# Patient Record
Sex: Female | Born: 1945 | Race: White | Hispanic: No | Marital: Married | Smoking: Former smoker
Health system: Southern US, Community
[De-identification: ages and names within clinical notes are randomized; demographics above are authoritative.]

## PROBLEM LIST (undated history)

## (undated) DIAGNOSIS — E119 Type 2 diabetes mellitus without complications: Secondary | ICD-10-CM

## (undated) DIAGNOSIS — J45909 Unspecified asthma, uncomplicated: Secondary | ICD-10-CM

## (undated) DIAGNOSIS — I10 Essential (primary) hypertension: Secondary | ICD-10-CM

## (undated) HISTORY — DX: Unspecified asthma, uncomplicated: J45.909

## (undated) HISTORY — DX: Essential (primary) hypertension: I10

## (undated) HISTORY — DX: Type 2 diabetes mellitus without complications: E11.9

---

## 1998-07-25 ENCOUNTER — Emergency Department (HOSPITAL_COMMUNITY): Admission: EM | Admit: 1998-07-25 | Discharge: 1998-07-25 | Payer: Self-pay | Admitting: Emergency Medicine

## 1998-07-25 ENCOUNTER — Encounter: Payer: Self-pay | Admitting: Emergency Medicine

## 1999-07-19 ENCOUNTER — Other Ambulatory Visit: Admission: RE | Admit: 1999-07-19 | Discharge: 1999-07-19 | Payer: Self-pay | Admitting: Family Medicine

## 2000-03-02 ENCOUNTER — Encounter: Payer: Self-pay | Admitting: Family Medicine

## 2000-03-02 ENCOUNTER — Encounter: Admission: RE | Admit: 2000-03-02 | Discharge: 2000-03-02 | Payer: Self-pay | Admitting: Family Medicine

## 2001-03-30 ENCOUNTER — Encounter: Admission: RE | Admit: 2001-03-30 | Discharge: 2001-03-30 | Payer: Self-pay | Admitting: Family Medicine

## 2001-03-30 ENCOUNTER — Encounter: Payer: Self-pay | Admitting: Family Medicine

## 2001-04-21 ENCOUNTER — Encounter: Payer: Self-pay | Admitting: Family Medicine

## 2001-04-21 ENCOUNTER — Encounter: Admission: RE | Admit: 2001-04-21 | Discharge: 2001-04-21 | Payer: Self-pay | Admitting: Family Medicine

## 2001-10-04 ENCOUNTER — Encounter: Payer: Self-pay | Admitting: Family Medicine

## 2001-10-04 ENCOUNTER — Encounter: Admission: RE | Admit: 2001-10-04 | Discharge: 2001-10-04 | Payer: Self-pay | Admitting: Family Medicine

## 2002-03-01 ENCOUNTER — Encounter: Admission: RE | Admit: 2002-03-01 | Discharge: 2002-03-01 | Payer: Self-pay | Admitting: Family Medicine

## 2002-03-01 ENCOUNTER — Encounter: Payer: Self-pay | Admitting: Family Medicine

## 2002-05-09 ENCOUNTER — Encounter: Payer: Self-pay | Admitting: Pulmonary Disease

## 2002-05-09 ENCOUNTER — Ambulatory Visit (HOSPITAL_COMMUNITY): Admission: RE | Admit: 2002-05-09 | Discharge: 2002-05-09 | Payer: Self-pay | Admitting: Pulmonary Disease

## 2002-06-29 ENCOUNTER — Other Ambulatory Visit: Admission: RE | Admit: 2002-06-29 | Discharge: 2002-06-29 | Payer: Self-pay | Admitting: Family Medicine

## 2004-01-03 ENCOUNTER — Other Ambulatory Visit: Admission: RE | Admit: 2004-01-03 | Discharge: 2004-01-03 | Payer: Self-pay | Admitting: Family Medicine

## 2004-09-20 ENCOUNTER — Encounter: Admission: RE | Admit: 2004-09-20 | Discharge: 2004-09-20 | Payer: Self-pay | Admitting: Family Medicine

## 2004-11-28 ENCOUNTER — Encounter: Admission: RE | Admit: 2004-11-28 | Discharge: 2004-11-28 | Payer: Self-pay | Admitting: Family Medicine

## 2004-12-19 ENCOUNTER — Encounter: Admission: RE | Admit: 2004-12-19 | Discharge: 2004-12-19 | Payer: Self-pay | Admitting: Family Medicine

## 2005-02-05 ENCOUNTER — Ambulatory Visit: Payer: Self-pay | Admitting: Internal Medicine

## 2005-03-24 ENCOUNTER — Ambulatory Visit: Payer: Self-pay | Admitting: Internal Medicine

## 2005-05-05 ENCOUNTER — Encounter: Admission: RE | Admit: 2005-05-05 | Discharge: 2005-05-05 | Payer: Self-pay | Admitting: Orthopedic Surgery

## 2005-06-18 ENCOUNTER — Ambulatory Visit: Payer: Self-pay | Admitting: Internal Medicine

## 2005-06-23 ENCOUNTER — Ambulatory Visit: Payer: Self-pay | Admitting: Internal Medicine

## 2005-06-25 ENCOUNTER — Ambulatory Visit: Payer: Self-pay | Admitting: Internal Medicine

## 2005-07-10 ENCOUNTER — Encounter (HOSPITAL_COMMUNITY): Admission: RE | Admit: 2005-07-10 | Discharge: 2005-09-10 | Payer: Self-pay | Admitting: Internal Medicine

## 2005-07-23 ENCOUNTER — Ambulatory Visit: Payer: Self-pay | Admitting: Internal Medicine

## 2005-09-05 ENCOUNTER — Ambulatory Visit: Payer: Self-pay | Admitting: Internal Medicine

## 2005-12-09 ENCOUNTER — Ambulatory Visit: Payer: Self-pay | Admitting: Internal Medicine

## 2006-03-31 ENCOUNTER — Encounter: Admission: RE | Admit: 2006-03-31 | Discharge: 2006-03-31 | Payer: Self-pay | Admitting: Orthopedic Surgery

## 2009-02-06 ENCOUNTER — Encounter: Admission: RE | Admit: 2009-02-06 | Discharge: 2009-02-06 | Payer: Self-pay | Admitting: Family Medicine

## 2009-02-13 ENCOUNTER — Encounter: Admission: RE | Admit: 2009-02-13 | Discharge: 2009-02-13 | Payer: Self-pay | Admitting: Family Medicine

## 2011-05-15 ENCOUNTER — Other Ambulatory Visit: Payer: Self-pay | Admitting: Family Medicine

## 2011-05-15 DIAGNOSIS — Z780 Asymptomatic menopausal state: Secondary | ICD-10-CM

## 2011-05-15 DIAGNOSIS — Z1231 Encounter for screening mammogram for malignant neoplasm of breast: Secondary | ICD-10-CM

## 2011-05-29 ENCOUNTER — Ambulatory Visit
Admission: RE | Admit: 2011-05-29 | Discharge: 2011-05-29 | Disposition: A | Discharge status: Home / Self Care | Payer: Medicare Other | Source: Ambulatory Visit | Attending: Family Medicine | Admitting: Family Medicine

## 2011-05-29 DIAGNOSIS — Z780 Asymptomatic menopausal state: Secondary | ICD-10-CM

## 2011-05-29 DIAGNOSIS — Z1231 Encounter for screening mammogram for malignant neoplasm of breast: Secondary | ICD-10-CM

## 2013-01-21 ENCOUNTER — Other Ambulatory Visit: Payer: Self-pay | Admitting: Family Medicine

## 2013-01-21 DIAGNOSIS — Z1231 Encounter for screening mammogram for malignant neoplasm of breast: Secondary | ICD-10-CM

## 2013-02-23 ENCOUNTER — Ambulatory Visit
Admission: RE | Admit: 2013-02-23 | Discharge: 2013-02-23 | Disposition: A | Discharge status: Home / Self Care | Payer: Medicare Other | Source: Ambulatory Visit | Attending: Family Medicine | Admitting: Family Medicine

## 2013-02-23 DIAGNOSIS — Z1231 Encounter for screening mammogram for malignant neoplasm of breast: Secondary | ICD-10-CM

## 2015-03-05 ENCOUNTER — Other Ambulatory Visit: Payer: Self-pay | Admitting: Family Medicine

## 2015-03-05 ENCOUNTER — Ambulatory Visit
Admission: RE | Admit: 2015-03-05 | Discharge: 2015-03-05 | Disposition: A | Discharge status: Home / Self Care | Payer: PPO | Source: Ambulatory Visit | Attending: Family Medicine | Admitting: Family Medicine

## 2015-03-05 DIAGNOSIS — R0602 Shortness of breath: Secondary | ICD-10-CM

## 2016-05-19 DIAGNOSIS — E119 Type 2 diabetes mellitus without complications: Secondary | ICD-10-CM | POA: Diagnosis not present

## 2016-05-19 DIAGNOSIS — Z23 Encounter for immunization: Secondary | ICD-10-CM | POA: Diagnosis not present

## 2016-05-19 DIAGNOSIS — E039 Hypothyroidism, unspecified: Secondary | ICD-10-CM | POA: Diagnosis not present

## 2016-05-19 DIAGNOSIS — E559 Vitamin D deficiency, unspecified: Secondary | ICD-10-CM | POA: Diagnosis not present

## 2016-05-19 DIAGNOSIS — E785 Hyperlipidemia, unspecified: Secondary | ICD-10-CM | POA: Diagnosis not present

## 2016-05-20 ENCOUNTER — Other Ambulatory Visit: Payer: Self-pay | Admitting: Family Medicine

## 2016-05-20 ENCOUNTER — Ambulatory Visit
Admission: RE | Admit: 2016-05-20 | Discharge: 2016-05-20 | Disposition: A | Discharge status: Home / Self Care | Payer: PPO | Source: Ambulatory Visit | Attending: Family Medicine | Admitting: Family Medicine

## 2016-05-20 DIAGNOSIS — M170 Bilateral primary osteoarthritis of knee: Secondary | ICD-10-CM | POA: Diagnosis not present

## 2016-05-20 DIAGNOSIS — E119 Type 2 diabetes mellitus without complications: Secondary | ICD-10-CM | POA: Diagnosis not present

## 2016-05-20 DIAGNOSIS — E039 Hypothyroidism, unspecified: Secondary | ICD-10-CM | POA: Diagnosis not present

## 2016-05-20 DIAGNOSIS — E782 Mixed hyperlipidemia: Secondary | ICD-10-CM | POA: Diagnosis not present

## 2016-08-19 DIAGNOSIS — E119 Type 2 diabetes mellitus without complications: Secondary | ICD-10-CM | POA: Diagnosis not present

## 2016-08-19 DIAGNOSIS — E039 Hypothyroidism, unspecified: Secondary | ICD-10-CM | POA: Diagnosis not present

## 2016-08-21 DIAGNOSIS — E119 Type 2 diabetes mellitus without complications: Secondary | ICD-10-CM | POA: Diagnosis not present

## 2016-08-21 DIAGNOSIS — I10 Essential (primary) hypertension: Secondary | ICD-10-CM | POA: Diagnosis not present

## 2016-08-21 DIAGNOSIS — Z23 Encounter for immunization: Secondary | ICD-10-CM | POA: Diagnosis not present

## 2016-08-21 DIAGNOSIS — E039 Hypothyroidism, unspecified: Secondary | ICD-10-CM | POA: Diagnosis not present

## 2016-08-21 DIAGNOSIS — Z0000 Encounter for general adult medical examination without abnormal findings: Secondary | ICD-10-CM | POA: Diagnosis not present

## 2016-09-01 DIAGNOSIS — M170 Bilateral primary osteoarthritis of knee: Secondary | ICD-10-CM | POA: Diagnosis not present

## 2016-09-08 DIAGNOSIS — M170 Bilateral primary osteoarthritis of knee: Secondary | ICD-10-CM | POA: Diagnosis not present

## 2016-09-15 DIAGNOSIS — M25569 Pain in unspecified knee: Secondary | ICD-10-CM | POA: Diagnosis not present

## 2016-09-15 DIAGNOSIS — M170 Bilateral primary osteoarthritis of knee: Secondary | ICD-10-CM | POA: Diagnosis not present

## 2016-09-22 DIAGNOSIS — M170 Bilateral primary osteoarthritis of knee: Secondary | ICD-10-CM | POA: Diagnosis not present

## 2016-09-22 DIAGNOSIS — Z6838 Body mass index (BMI) 38.0-38.9, adult: Secondary | ICD-10-CM | POA: Diagnosis not present

## 2016-09-29 DIAGNOSIS — M170 Bilateral primary osteoarthritis of knee: Secondary | ICD-10-CM | POA: Diagnosis not present

## 2017-01-13 ENCOUNTER — Other Ambulatory Visit: Payer: Self-pay | Admitting: Family Medicine

## 2017-01-13 DIAGNOSIS — Z6838 Body mass index (BMI) 38.0-38.9, adult: Secondary | ICD-10-CM | POA: Diagnosis not present

## 2017-01-13 DIAGNOSIS — Z23 Encounter for immunization: Secondary | ICD-10-CM | POA: Diagnosis not present

## 2017-01-13 DIAGNOSIS — Z1231 Encounter for screening mammogram for malignant neoplasm of breast: Secondary | ICD-10-CM

## 2017-01-13 DIAGNOSIS — M8580 Other specified disorders of bone density and structure, unspecified site: Secondary | ICD-10-CM

## 2017-01-13 DIAGNOSIS — Z1211 Encounter for screening for malignant neoplasm of colon: Secondary | ICD-10-CM | POA: Diagnosis not present

## 2017-01-13 DIAGNOSIS — Z0000 Encounter for general adult medical examination without abnormal findings: Secondary | ICD-10-CM | POA: Diagnosis not present

## 2017-01-29 ENCOUNTER — Ambulatory Visit
Admission: RE | Admit: 2017-01-29 | Discharge: 2017-01-29 | Disposition: A | Discharge status: Home / Self Care | Payer: PPO | Source: Ambulatory Visit | Attending: Family Medicine | Admitting: Family Medicine

## 2017-01-29 DIAGNOSIS — M8589 Other specified disorders of bone density and structure, multiple sites: Secondary | ICD-10-CM | POA: Diagnosis not present

## 2017-01-29 DIAGNOSIS — M8580 Other specified disorders of bone density and structure, unspecified site: Secondary | ICD-10-CM

## 2017-01-29 DIAGNOSIS — Z1231 Encounter for screening mammogram for malignant neoplasm of breast: Secondary | ICD-10-CM

## 2017-01-29 DIAGNOSIS — Z780 Asymptomatic menopausal state: Secondary | ICD-10-CM | POA: Diagnosis not present

## 2017-02-16 DIAGNOSIS — E039 Hypothyroidism, unspecified: Secondary | ICD-10-CM | POA: Diagnosis not present

## 2017-02-16 DIAGNOSIS — Z136 Encounter for screening for cardiovascular disorders: Secondary | ICD-10-CM | POA: Diagnosis not present

## 2017-02-16 DIAGNOSIS — I10 Essential (primary) hypertension: Secondary | ICD-10-CM | POA: Diagnosis not present

## 2017-02-16 DIAGNOSIS — E119 Type 2 diabetes mellitus without complications: Secondary | ICD-10-CM | POA: Diagnosis not present

## 2017-02-18 DIAGNOSIS — E119 Type 2 diabetes mellitus without complications: Secondary | ICD-10-CM | POA: Diagnosis not present

## 2017-02-18 DIAGNOSIS — I10 Essential (primary) hypertension: Secondary | ICD-10-CM | POA: Diagnosis not present

## 2017-02-18 DIAGNOSIS — E785 Hyperlipidemia, unspecified: Secondary | ICD-10-CM | POA: Diagnosis not present

## 2017-02-18 DIAGNOSIS — E039 Hypothyroidism, unspecified: Secondary | ICD-10-CM | POA: Diagnosis not present

## 2017-03-12 ENCOUNTER — Encounter: Payer: Self-pay | Admitting: Family Medicine

## 2017-03-31 DIAGNOSIS — H524 Presbyopia: Secondary | ICD-10-CM | POA: Diagnosis not present

## 2017-03-31 DIAGNOSIS — H2513 Age-related nuclear cataract, bilateral: Secondary | ICD-10-CM | POA: Diagnosis not present

## 2017-03-31 DIAGNOSIS — H353131 Nonexudative age-related macular degeneration, bilateral, early dry stage: Secondary | ICD-10-CM | POA: Diagnosis not present

## 2017-04-20 DIAGNOSIS — Z6838 Body mass index (BMI) 38.0-38.9, adult: Secondary | ICD-10-CM | POA: Diagnosis not present

## 2017-04-20 DIAGNOSIS — M8580 Other specified disorders of bone density and structure, unspecified site: Secondary | ICD-10-CM | POA: Diagnosis not present

## 2017-04-20 DIAGNOSIS — E559 Vitamin D deficiency, unspecified: Secondary | ICD-10-CM | POA: Diagnosis not present

## 2017-05-18 DIAGNOSIS — I10 Essential (primary) hypertension: Secondary | ICD-10-CM | POA: Diagnosis not present

## 2017-05-18 DIAGNOSIS — E039 Hypothyroidism, unspecified: Secondary | ICD-10-CM | POA: Diagnosis not present

## 2017-05-18 DIAGNOSIS — E119 Type 2 diabetes mellitus without complications: Secondary | ICD-10-CM | POA: Diagnosis not present

## 2017-05-18 DIAGNOSIS — E559 Vitamin D deficiency, unspecified: Secondary | ICD-10-CM | POA: Diagnosis not present

## 2017-05-22 DIAGNOSIS — E039 Hypothyroidism, unspecified: Secondary | ICD-10-CM | POA: Diagnosis not present

## 2017-05-22 DIAGNOSIS — I10 Essential (primary) hypertension: Secondary | ICD-10-CM | POA: Diagnosis not present

## 2017-05-22 DIAGNOSIS — Z23 Encounter for immunization: Secondary | ICD-10-CM | POA: Diagnosis not present

## 2017-05-22 DIAGNOSIS — E119 Type 2 diabetes mellitus without complications: Secondary | ICD-10-CM | POA: Diagnosis not present

## 2017-05-22 DIAGNOSIS — Z6839 Body mass index (BMI) 39.0-39.9, adult: Secondary | ICD-10-CM | POA: Diagnosis not present

## 2017-11-09 DIAGNOSIS — J441 Chronic obstructive pulmonary disease with (acute) exacerbation: Secondary | ICD-10-CM | POA: Diagnosis not present

## 2017-11-09 DIAGNOSIS — J209 Acute bronchitis, unspecified: Secondary | ICD-10-CM | POA: Diagnosis not present

## 2017-11-09 DIAGNOSIS — J069 Acute upper respiratory infection, unspecified: Secondary | ICD-10-CM | POA: Diagnosis not present

## 2017-11-11 DIAGNOSIS — Z6839 Body mass index (BMI) 39.0-39.9, adult: Secondary | ICD-10-CM | POA: Diagnosis not present

## 2017-11-11 DIAGNOSIS — J189 Pneumonia, unspecified organism: Secondary | ICD-10-CM | POA: Diagnosis not present

## 2017-11-11 DIAGNOSIS — J441 Chronic obstructive pulmonary disease with (acute) exacerbation: Secondary | ICD-10-CM | POA: Diagnosis not present

## 2017-11-11 DIAGNOSIS — J111 Influenza due to unidentified influenza virus with other respiratory manifestations: Secondary | ICD-10-CM | POA: Diagnosis not present

## 2017-11-18 DIAGNOSIS — J189 Pneumonia, unspecified organism: Secondary | ICD-10-CM | POA: Diagnosis not present

## 2017-11-18 DIAGNOSIS — E039 Hypothyroidism, unspecified: Secondary | ICD-10-CM | POA: Diagnosis not present

## 2017-11-18 DIAGNOSIS — J111 Influenza due to unidentified influenza virus with other respiratory manifestations: Secondary | ICD-10-CM | POA: Diagnosis not present

## 2017-11-18 DIAGNOSIS — J441 Chronic obstructive pulmonary disease with (acute) exacerbation: Secondary | ICD-10-CM | POA: Diagnosis not present

## 2017-11-18 DIAGNOSIS — E559 Vitamin D deficiency, unspecified: Secondary | ICD-10-CM | POA: Diagnosis not present

## 2017-11-18 DIAGNOSIS — I10 Essential (primary) hypertension: Secondary | ICD-10-CM | POA: Diagnosis not present

## 2017-11-20 DIAGNOSIS — I10 Essential (primary) hypertension: Secondary | ICD-10-CM | POA: Diagnosis not present

## 2017-11-20 DIAGNOSIS — E039 Hypothyroidism, unspecified: Secondary | ICD-10-CM | POA: Diagnosis not present

## 2017-11-20 DIAGNOSIS — Z6839 Body mass index (BMI) 39.0-39.9, adult: Secondary | ICD-10-CM | POA: Diagnosis not present

## 2017-11-20 DIAGNOSIS — E782 Mixed hyperlipidemia: Secondary | ICD-10-CM | POA: Diagnosis not present

## 2017-11-20 DIAGNOSIS — E559 Vitamin D deficiency, unspecified: Secondary | ICD-10-CM | POA: Diagnosis not present

## 2017-12-23 DIAGNOSIS — E119 Type 2 diabetes mellitus without complications: Secondary | ICD-10-CM | POA: Diagnosis not present

## 2017-12-23 DIAGNOSIS — I10 Essential (primary) hypertension: Secondary | ICD-10-CM | POA: Diagnosis not present

## 2017-12-23 DIAGNOSIS — E785 Hyperlipidemia, unspecified: Secondary | ICD-10-CM | POA: Diagnosis not present

## 2017-12-23 DIAGNOSIS — E039 Hypothyroidism, unspecified: Secondary | ICD-10-CM | POA: Diagnosis not present

## 2017-12-25 ENCOUNTER — Ambulatory Visit
Admission: RE | Admit: 2017-12-25 | Discharge: 2017-12-25 | Disposition: A | Discharge status: Home / Self Care | Payer: PPO | Source: Ambulatory Visit | Attending: Family Medicine | Admitting: Family Medicine

## 2017-12-25 ENCOUNTER — Other Ambulatory Visit: Payer: Self-pay | Admitting: Family Medicine

## 2017-12-25 DIAGNOSIS — M170 Bilateral primary osteoarthritis of knee: Secondary | ICD-10-CM | POA: Diagnosis not present

## 2017-12-25 DIAGNOSIS — M1711 Unilateral primary osteoarthritis, right knee: Secondary | ICD-10-CM | POA: Diagnosis not present

## 2017-12-25 DIAGNOSIS — E039 Hypothyroidism, unspecified: Secondary | ICD-10-CM | POA: Diagnosis not present

## 2017-12-25 DIAGNOSIS — I10 Essential (primary) hypertension: Secondary | ICD-10-CM | POA: Diagnosis not present

## 2017-12-25 DIAGNOSIS — E559 Vitamin D deficiency, unspecified: Secondary | ICD-10-CM | POA: Diagnosis not present

## 2017-12-25 DIAGNOSIS — Z6841 Body Mass Index (BMI) 40.0 and over, adult: Secondary | ICD-10-CM | POA: Diagnosis not present

## 2017-12-25 DIAGNOSIS — M1712 Unilateral primary osteoarthritis, left knee: Secondary | ICD-10-CM | POA: Diagnosis not present

## 2017-12-25 DIAGNOSIS — E782 Mixed hyperlipidemia: Secondary | ICD-10-CM | POA: Diagnosis not present

## 2018-01-19 ENCOUNTER — Other Ambulatory Visit: Payer: Self-pay | Admitting: Family Medicine

## 2018-01-19 DIAGNOSIS — I10 Essential (primary) hypertension: Secondary | ICD-10-CM | POA: Diagnosis not present

## 2018-01-19 DIAGNOSIS — Z1231 Encounter for screening mammogram for malignant neoplasm of breast: Secondary | ICD-10-CM

## 2018-01-19 DIAGNOSIS — Z0000 Encounter for general adult medical examination without abnormal findings: Secondary | ICD-10-CM | POA: Diagnosis not present

## 2018-01-19 DIAGNOSIS — E119 Type 2 diabetes mellitus without complications: Secondary | ICD-10-CM | POA: Diagnosis not present

## 2018-01-19 DIAGNOSIS — Z1211 Encounter for screening for malignant neoplasm of colon: Secondary | ICD-10-CM | POA: Diagnosis not present

## 2018-01-19 DIAGNOSIS — Z6841 Body Mass Index (BMI) 40.0 and over, adult: Secondary | ICD-10-CM | POA: Diagnosis not present

## 2018-01-19 DIAGNOSIS — E039 Hypothyroidism, unspecified: Secondary | ICD-10-CM | POA: Diagnosis not present

## 2018-01-19 DIAGNOSIS — E785 Hyperlipidemia, unspecified: Secondary | ICD-10-CM | POA: Diagnosis not present

## 2018-02-09 ENCOUNTER — Ambulatory Visit
Admission: RE | Admit: 2018-02-09 | Discharge: 2018-02-09 | Disposition: A | Discharge status: Home / Self Care | Payer: PPO | Source: Ambulatory Visit | Attending: Family Medicine | Admitting: Family Medicine

## 2018-02-09 DIAGNOSIS — Z1231 Encounter for screening mammogram for malignant neoplasm of breast: Secondary | ICD-10-CM | POA: Diagnosis not present

## 2018-02-10 DIAGNOSIS — E039 Hypothyroidism, unspecified: Secondary | ICD-10-CM | POA: Diagnosis not present

## 2018-02-12 DIAGNOSIS — Z6839 Body mass index (BMI) 39.0-39.9, adult: Secondary | ICD-10-CM | POA: Diagnosis not present

## 2018-02-12 DIAGNOSIS — K219 Gastro-esophageal reflux disease without esophagitis: Secondary | ICD-10-CM | POA: Diagnosis not present

## 2018-02-12 DIAGNOSIS — E039 Hypothyroidism, unspecified: Secondary | ICD-10-CM | POA: Diagnosis not present

## 2018-03-16 DIAGNOSIS — E039 Hypothyroidism, unspecified: Secondary | ICD-10-CM | POA: Diagnosis not present

## 2018-03-16 DIAGNOSIS — E785 Hyperlipidemia, unspecified: Secondary | ICD-10-CM | POA: Diagnosis not present

## 2018-03-16 DIAGNOSIS — I10 Essential (primary) hypertension: Secondary | ICD-10-CM | POA: Diagnosis not present

## 2018-03-16 DIAGNOSIS — E119 Type 2 diabetes mellitus without complications: Secondary | ICD-10-CM | POA: Diagnosis not present

## 2018-03-18 DIAGNOSIS — I10 Essential (primary) hypertension: Secondary | ICD-10-CM | POA: Diagnosis not present

## 2018-03-18 DIAGNOSIS — E782 Mixed hyperlipidemia: Secondary | ICD-10-CM | POA: Diagnosis not present

## 2018-03-18 DIAGNOSIS — E119 Type 2 diabetes mellitus without complications: Secondary | ICD-10-CM | POA: Diagnosis not present

## 2018-03-18 DIAGNOSIS — Z6841 Body Mass Index (BMI) 40.0 and over, adult: Secondary | ICD-10-CM | POA: Diagnosis not present

## 2018-04-15 ENCOUNTER — Encounter: Payer: Self-pay | Admitting: Family Medicine

## 2018-06-15 DIAGNOSIS — E119 Type 2 diabetes mellitus without complications: Secondary | ICD-10-CM | POA: Diagnosis not present

## 2018-06-15 DIAGNOSIS — E559 Vitamin D deficiency, unspecified: Secondary | ICD-10-CM | POA: Diagnosis not present

## 2018-06-15 DIAGNOSIS — I10 Essential (primary) hypertension: Secondary | ICD-10-CM | POA: Diagnosis not present

## 2018-06-15 DIAGNOSIS — E039 Hypothyroidism, unspecified: Secondary | ICD-10-CM | POA: Diagnosis not present

## 2018-06-18 DIAGNOSIS — E119 Type 2 diabetes mellitus without complications: Secondary | ICD-10-CM | POA: Diagnosis not present

## 2018-06-18 DIAGNOSIS — E039 Hypothyroidism, unspecified: Secondary | ICD-10-CM | POA: Diagnosis not present

## 2018-06-18 DIAGNOSIS — E559 Vitamin D deficiency, unspecified: Secondary | ICD-10-CM | POA: Diagnosis not present

## 2018-06-18 DIAGNOSIS — Z6841 Body Mass Index (BMI) 40.0 and over, adult: Secondary | ICD-10-CM | POA: Diagnosis not present

## 2018-07-16 DIAGNOSIS — Z6841 Body Mass Index (BMI) 40.0 and over, adult: Secondary | ICD-10-CM | POA: Diagnosis not present

## 2018-07-16 DIAGNOSIS — I10 Essential (primary) hypertension: Secondary | ICD-10-CM | POA: Diagnosis not present

## 2018-07-16 DIAGNOSIS — E119 Type 2 diabetes mellitus without complications: Secondary | ICD-10-CM | POA: Diagnosis not present

## 2018-07-28 DIAGNOSIS — J449 Chronic obstructive pulmonary disease, unspecified: Secondary | ICD-10-CM | POA: Diagnosis not present

## 2018-07-28 DIAGNOSIS — J069 Acute upper respiratory infection, unspecified: Secondary | ICD-10-CM | POA: Diagnosis not present

## 2018-07-28 DIAGNOSIS — Z6841 Body Mass Index (BMI) 40.0 and over, adult: Secondary | ICD-10-CM | POA: Diagnosis not present

## 2018-07-28 DIAGNOSIS — I10 Essential (primary) hypertension: Secondary | ICD-10-CM | POA: Diagnosis not present

## 2018-07-28 DIAGNOSIS — E119 Type 2 diabetes mellitus without complications: Secondary | ICD-10-CM | POA: Diagnosis not present

## 2018-09-15 DIAGNOSIS — E119 Type 2 diabetes mellitus without complications: Secondary | ICD-10-CM | POA: Diagnosis not present

## 2018-09-15 DIAGNOSIS — E039 Hypothyroidism, unspecified: Secondary | ICD-10-CM | POA: Diagnosis not present

## 2018-09-15 DIAGNOSIS — E559 Vitamin D deficiency, unspecified: Secondary | ICD-10-CM | POA: Diagnosis not present

## 2018-09-17 DIAGNOSIS — Z23 Encounter for immunization: Secondary | ICD-10-CM | POA: Diagnosis not present

## 2018-09-17 DIAGNOSIS — K219 Gastro-esophageal reflux disease without esophagitis: Secondary | ICD-10-CM | POA: Diagnosis not present

## 2018-09-17 DIAGNOSIS — E559 Vitamin D deficiency, unspecified: Secondary | ICD-10-CM | POA: Diagnosis not present

## 2018-09-17 DIAGNOSIS — Z6841 Body Mass Index (BMI) 40.0 and over, adult: Secondary | ICD-10-CM | POA: Diagnosis not present

## 2018-09-17 DIAGNOSIS — E119 Type 2 diabetes mellitus without complications: Secondary | ICD-10-CM | POA: Diagnosis not present

## 2018-09-17 DIAGNOSIS — E039 Hypothyroidism, unspecified: Secondary | ICD-10-CM | POA: Diagnosis not present

## 2018-10-01 DIAGNOSIS — Z6839 Body mass index (BMI) 39.0-39.9, adult: Secondary | ICD-10-CM | POA: Diagnosis not present

## 2018-10-01 DIAGNOSIS — J069 Acute upper respiratory infection, unspecified: Secondary | ICD-10-CM | POA: Diagnosis not present

## 2018-10-01 DIAGNOSIS — J441 Chronic obstructive pulmonary disease with (acute) exacerbation: Secondary | ICD-10-CM | POA: Diagnosis not present

## 2018-10-05 DIAGNOSIS — J449 Chronic obstructive pulmonary disease, unspecified: Secondary | ICD-10-CM | POA: Diagnosis not present

## 2018-10-05 DIAGNOSIS — Z6839 Body mass index (BMI) 39.0-39.9, adult: Secondary | ICD-10-CM | POA: Diagnosis not present

## 2018-10-05 DIAGNOSIS — J441 Chronic obstructive pulmonary disease with (acute) exacerbation: Secondary | ICD-10-CM | POA: Diagnosis not present

## 2018-10-05 DIAGNOSIS — I10 Essential (primary) hypertension: Secondary | ICD-10-CM | POA: Diagnosis not present

## 2018-11-05 DIAGNOSIS — E039 Hypothyroidism, unspecified: Secondary | ICD-10-CM | POA: Diagnosis not present

## 2018-11-16 DIAGNOSIS — E119 Type 2 diabetes mellitus without complications: Secondary | ICD-10-CM | POA: Diagnosis not present

## 2018-11-16 DIAGNOSIS — I10 Essential (primary) hypertension: Secondary | ICD-10-CM | POA: Diagnosis not present

## 2018-11-16 DIAGNOSIS — E039 Hypothyroidism, unspecified: Secondary | ICD-10-CM | POA: Diagnosis not present

## 2018-11-16 DIAGNOSIS — Z719 Counseling, unspecified: Secondary | ICD-10-CM | POA: Diagnosis not present

## 2018-12-30 DIAGNOSIS — J449 Chronic obstructive pulmonary disease, unspecified: Secondary | ICD-10-CM | POA: Diagnosis not present

## 2018-12-30 DIAGNOSIS — I10 Essential (primary) hypertension: Secondary | ICD-10-CM | POA: Diagnosis not present

## 2018-12-30 DIAGNOSIS — E119 Type 2 diabetes mellitus without complications: Secondary | ICD-10-CM | POA: Diagnosis not present

## 2018-12-30 DIAGNOSIS — E039 Hypothyroidism, unspecified: Secondary | ICD-10-CM | POA: Diagnosis not present

## 2019-01-19 ENCOUNTER — Other Ambulatory Visit: Payer: Self-pay

## 2019-01-19 NOTE — Patient Outreach (Signed)
[facility]) Care Management  01/19/2019  Gabrielle Randall [birth year] [id number]   Telephone Screen  Referral Date: 01/19/2019 Referral Source: MD Office Referral Reason: "pt. Can't get any insulin or Ozempic due to expense of drugs with her insurance,need help getting meds she can't afford" Insurance: HTA   Incoming call from patient returning RN CM call. Spoke with patient regarding referral source and reason. She shares that every year she runs into the donut hole early and her Diabetic meds become entirely too expensive for her to afford. She voices last year she was taking Ozempic and Janumet. However, Ozempic became too expensive and she had to stop taking it. This year she is currently on Lantus and Janumet. However, patient concerned and does not feel like "Lantus is really working." She states that she feels like her blood sugars were better controlled on Ozempic. Patient report that she has only been on Lantus for about two weeks or so. She voices that cbgs are ranging in the mornings around 130-150s which she feels is too high. PCP has been managing Diabetes for the past four or five years since patient was diagnosed. However, she voices that she is considering seeing a specialist. She states she was given coupons for Diabetic meds but because she has Medicare she is not able to use them. Patient voices she is taking about five meds total. She denies any issues affording and/or managing her other meds. She report that she is knowledgeable regarding Diabetes and adhering to diet regimen. She is independent with ADLs/IADLs. She lives with supportive spouse and has three daughters that live nearby to assist her. She denies any issues with transportation. [facility] services reviewed and discussed with patient. She feels that she only needs pharmacy assistance at this time and provided verbal consent for services.   Plan: RN CM will send [facility] pharmacy referral for possible med  assistance.   [name], RN,BSN,CCM [facility] Care Management Telephonic Care Management Coordinator Direct Phone: [phone] Toll Free: [phone] Fax: [phone]

## 2019-01-19 NOTE — Patient Outreach (Signed)
[facility]) Care Management  01/19/2019  Gabrielle Randall Dec 28, 1945 [id number]   Telephone Screen  Referral Date: 01/19/2019 Referral Source: MD Office Referral Reason: "pt. Can't get any insulin or Ozempic due to expense of drugs with her insurance,need help getting meds she can't afford" Insurance: HTA   Outreach attempt # 1 to patient. No answer. RN CM left HIPAA compliant voicemail message along with contact info.    Plan: RN CM will make outreach attempt to patient within 3-4 business days. RN CM will send unsuccessful outreach letter to patient.   [name], RN,BSN,CCM [facility] Care Management Telephonic Care Management Coordinator Direct Phone: [phone] Toll Free: [phone] Fax: [phone]

## 2019-01-21 ENCOUNTER — Other Ambulatory Visit: Payer: Self-pay | Admitting: Pharmacist

## 2019-01-21 ENCOUNTER — Other Ambulatory Visit: Payer: Self-pay | Admitting: Pharmacy Technician

## 2019-01-21 NOTE — Patient Outreach (Signed)
[place] [facility]) Care Management  [place]   01/21/2019  Gabrielle Randall Jan 09, 1946 [id number]  Reason for referral: Medication Assistance  Referral source: Dr. Ernie Hew Current insurance: [employer]  PMHx includes but not limited to:  T2DM, COPD, HTN, obesity  Outreach:  Successful telephone call with [name].  HIPAA identifiers verified.   Subjective:  Patient reports she has been on several medications for her diabetes over the last year but does not recall all the names.  She states her PCP Dr. Ernie Hew prefers for her to be on Ozempic but she had to switch to Lantus a few weeks ago due to cost.  She states she does not feel that her diabetes is as well controlled on Lantus but cannot recall specific CBGs.  She does not remember last A1C and states she is having lab work next Monday to re-check.    Objective: No results found for: CREATININE  No results found for: HGBA1C  Lipid Panel  No results found for: CHOL, TRIG, HDL, CHOLHDL, VLDL, LDLCALC, LDLDIRECT  BP Readings from Last 3 Encounters:  No data found for BP    Allergies not on file  Medications Reviewed Today   Medications were not reviewed prior to this encounter     Assessment: Drugs sorted by system:  Cardiovascular: losartan-HCTZ  Pulmonary/Allergy: albuterol nebulizer / HFA, budesonide-formoterol  Gastrointestinal: pantoprazole  Endocrine: insulin glargine, levothyroxine, sitagliptin-metformin  Pain: acetaminophen  Vitamins/Minerals/Supplements: cholecalciferol  Medication Review Findings:  . Taking 3000 units Vitamin D daily, patient states she is getting lab work on Monday to check Vitamin D levels and will have PCP address dose as needed   Medication Assistance Findings:  Medication assistance needs identified: Ozempic  Extra Help:  Not eligible for Extra Help Low Income Subsidy based on reported income and assets  Patient Assistance Programs: Ozempic  made by [place] requirement met: Yes o Out-of-pocket prescription expenditure met:   Not Applicable - Patient has met application requirements to apply for this patient assistance program.   - Reviewed program requirements with patient.   - Patient aware that she will need to discuss transition back to Ozempic if approved for PAP  Plan: . I will route patient assistance letter to [place] technician who will coordinate patient assistance program application process for medications listed above.  [facility] pharmacy technician will assist with obtaining all required documents from both patient and provider(s) and submit application(s) once completed.    Ralene Bathe, PharmD, [place] [phone]

## 2019-01-21 NOTE — Patient Outreach (Signed)
[facility]) Care Management  01/21/2019  IDELL DUQUAINE [birth year] [id number]                          Medication Assistance Referral  Referral From: [facility] RPh Doree Fudge  Medication/Company: Franki Monte / Novo Nordisk Patient application portion:  Mining engineer portion: Faxed  to Dr. Loreen Freud   Follow up:  Will follow up with patient in 7-10 business days to confirm application(s) have been received.  Suzan Slick Effie Shy CPhT Certified Pharmacy Technician [facility] Care Management Direct Dial:[phone]

## 2019-01-25 DIAGNOSIS — E119 Type 2 diabetes mellitus without complications: Secondary | ICD-10-CM | POA: Diagnosis not present

## 2019-01-25 DIAGNOSIS — I10 Essential (primary) hypertension: Secondary | ICD-10-CM | POA: Diagnosis not present

## 2019-01-25 DIAGNOSIS — E039 Hypothyroidism, unspecified: Secondary | ICD-10-CM | POA: Diagnosis not present

## 2019-01-26 ENCOUNTER — Ambulatory Visit: Payer: Self-pay | Admitting: Pharmacist

## 2019-01-27 DIAGNOSIS — E039 Hypothyroidism, unspecified: Secondary | ICD-10-CM | POA: Diagnosis not present

## 2019-01-27 DIAGNOSIS — I10 Essential (primary) hypertension: Secondary | ICD-10-CM | POA: Diagnosis not present

## 2019-01-27 DIAGNOSIS — E119 Type 2 diabetes mellitus without complications: Secondary | ICD-10-CM | POA: Diagnosis not present

## 2019-01-28 ENCOUNTER — Other Ambulatory Visit: Payer: Self-pay | Admitting: Pharmacist

## 2019-01-28 NOTE — Patient Outreach (Addendum)
[facility]) Care Management  [facility] 01/28/2019  ALYLA NIEDERHAUSER 1946-01-01 [id number]  Incoming call and voicemail from Ms. Launa Flight.  Patient states she has not received mailed patient assistance program application for Ozempic from [facility] yet.   Successful return call to patient. I advised patient that it may take a few more days for application to arrive in the mail since it may not have been picked up by [facility] mail service until this past Monday, June 1st.  Patient voiced understanding.  She requests that application also be emailed to her daughter in the meantime.  Email: [email or web address]  Plan: Will have [facility] pharmacy technician email application to patient at provided email address and f/u with patient to ensure receipt.   Haynes Hoehn, PharmD, [facility] Clinical Pharmacist [facility] [employer] [phone]

## 2019-02-04 ENCOUNTER — Other Ambulatory Visit: Payer: Self-pay | Admitting: Pharmacy Technician

## 2019-02-04 NOTE — Patient Outreach (Signed)
[place] [facility]) Care Management  02/04/2019  AILEEN AMORE 01/03/1946 [id number]   Received patient portion(s) of patient assistance application for Ozempic. Re-faxed provider portion of application to Dr. Ernie Hew.  Will submit completed application to [name] once documents have been received.  Maud Deed Chana Bode [place] Certified Pharmacy Technician [place] Management Direct [phone]

## 2019-02-14 ENCOUNTER — Other Ambulatory Visit: Payer: Self-pay | Admitting: Pharmacy Technician

## 2019-02-14 ENCOUNTER — Other Ambulatory Visit: Payer: Self-pay | Admitting: Pharmacist

## 2019-02-14 NOTE — Patient Outreach (Signed)
[name] [facility]) Care Management  02/14/2019  Gabrielle Randall 07-27-1946 [id number]   Received provider portion(s) of patient assistance application for Ozempic. Faxed completed application and required documents into [name].  Will follow up with company in 5-7 business days to check status of application.  Maud Deed Chana Bode [place] Certified Pharmacy Technician [place] Management Direct [phone]

## 2019-02-14 NOTE — Patient Outreach (Addendum)
[name]) Care Management  [place] 02/14/2019  Gabrielle Randall September 30, 1945 [id number]  Reason for call: Patient assistance program application forms returned from Dr. Ernie Hew for [facility] however no dose included.    F/u call to Dr. Ernie Hew to clarify medication dose.  Will await call back from office.   Ralene Bathe, PharmD, [place] [phone]    Addendum: Incoming call from office.  Patient will start at 0.25mg  subQ once weekly per Dr. Ernie Hew.  Will update application and submit to [name].   Ralene Bathe, PharmD, [place] [phone]

## 2019-02-16 ENCOUNTER — Ambulatory Visit: Payer: PPO | Admitting: Pharmacist

## 2019-02-22 ENCOUNTER — Other Ambulatory Visit: Payer: Self-pay | Admitting: Pharmacy Technician

## 2019-02-22 NOTE — Patient Outreach (Signed)
[place] [facility]) Care Management  02/22/2019  Gabrielle Randall 06/18/1946 [id number]   Follow up call placed to [name] regarding patient assistance application(s) for Ozempic , Hilda Blades confirms patient has been approved as of 6/30 until 07/25/19. Medication to arrive at providers office in 1-14 business days.  Follow up:  Will follow up with patient in 10-14 business days to confirm medication has been received.   Maud Deed Chana Bode [place] Certified Pharmacy Technician [place] Management Direct [phone]

## 2019-03-10 ENCOUNTER — Other Ambulatory Visit: Payer: Self-pay | Admitting: Pharmacist

## 2019-03-10 DIAGNOSIS — E119 Type 2 diabetes mellitus without complications: Secondary | ICD-10-CM | POA: Diagnosis not present

## 2019-03-10 NOTE — Patient Outreach (Signed)
[place] [facility]) Care Management [place]  03/10/2019  Gabrielle Randall December 08, 1945 [id number]  Incoming call and voicemail received from [name].  Per patient, she had trouble receiving supply of Ozempic shipped from [name] due to company shipping medication to an old office address for Dr. Ernie Hew.  Patient has now been able to pick up medication and has made Dr. Ival Bible office aware of issue.  She denies any other questions or concerns at this time.  She states supply she received should last through November and she is aware to re-apply at that time for 2020.    Will update [facility] pharmacy technician regarding address change for provider.  Will also update [facility] CM office staff to change office address for provider in [facility].    [facility] pharmacy case is being closed due to the following reasons:  -Goals of care have been met. -I have provided my contact information if patient or family needs to reach out to me in the future.   -Thank you for allowing [facility] pharmacy to be involved in this patient's care.     Ralene Bathe, PharmD, [place] [phone]

## 2019-03-14 DIAGNOSIS — Z0000 Encounter for general adult medical examination without abnormal findings: Secondary | ICD-10-CM | POA: Diagnosis not present

## 2019-03-14 DIAGNOSIS — E119 Type 2 diabetes mellitus without complications: Secondary | ICD-10-CM | POA: Diagnosis not present

## 2019-03-28 ENCOUNTER — Other Ambulatory Visit: Payer: Self-pay

## 2019-03-29 DIAGNOSIS — E782 Mixed hyperlipidemia: Secondary | ICD-10-CM | POA: Diagnosis not present

## 2019-03-29 DIAGNOSIS — E559 Vitamin D deficiency, unspecified: Secondary | ICD-10-CM | POA: Diagnosis not present

## 2019-03-29 DIAGNOSIS — E039 Hypothyroidism, unspecified: Secondary | ICD-10-CM | POA: Diagnosis not present

## 2019-03-31 DIAGNOSIS — E119 Type 2 diabetes mellitus without complications: Secondary | ICD-10-CM | POA: Diagnosis not present

## 2019-03-31 DIAGNOSIS — B354 Tinea corporis: Secondary | ICD-10-CM | POA: Diagnosis not present

## 2019-03-31 DIAGNOSIS — K219 Gastro-esophageal reflux disease without esophagitis: Secondary | ICD-10-CM | POA: Diagnosis not present

## 2019-05-11 DIAGNOSIS — E119 Type 2 diabetes mellitus without complications: Secondary | ICD-10-CM | POA: Diagnosis not present

## 2019-05-11 DIAGNOSIS — E782 Mixed hyperlipidemia: Secondary | ICD-10-CM | POA: Diagnosis not present

## 2019-05-11 DIAGNOSIS — E559 Vitamin D deficiency, unspecified: Secondary | ICD-10-CM | POA: Diagnosis not present

## 2019-05-11 DIAGNOSIS — E039 Hypothyroidism, unspecified: Secondary | ICD-10-CM | POA: Diagnosis not present

## 2019-05-12 ENCOUNTER — Other Ambulatory Visit: Payer: Self-pay | Admitting: Pharmacist

## 2019-05-12 NOTE — Patient Outreach (Signed)
[place] [facility]) Care Management  [place] 05/12/2019  Gabrielle Randall 12/30/1945 [id number]  Incoming call from patient.  She reports her Ozempic dose was recently increased to 0.5mg /week.  She will run out of medication from patient assistance program therefore in mid December.  She wants to know how to update application on file with [name].  Reviewed with patient that her provider, Dr. Ernie Hew, will need to send in updated prescription to company.  Patient voiced understanding.  I will contact [place] technician and if any other documents are needed, she will also fax over to Dr. Ernie Hew.   Ralene Bathe, PharmD, [place] [phone]

## 2019-05-16 ENCOUNTER — Ambulatory Visit: Payer: Self-pay | Admitting: Pharmacist

## 2019-05-16 ENCOUNTER — Other Ambulatory Visit: Payer: Self-pay | Admitting: Pharmacist

## 2019-05-16 NOTE — Patient Outreach (Signed)
[place] [facility]) Care Management  [place] 05/16/2019  LOUAN BASE 12/09/1945 [id number]  F/u call to patient re: Ozempic dose change.  Updated patient that I called Dr. Ival Bible office and left message for office to call in updated dose or fax in provider portion of application.  Patient voiced appreciation and understanding.  No further questions.  Will keep [place] case closed at this time.   Ralene Bathe, PharmD, [place] [phone]

## 2019-05-26 DIAGNOSIS — Z23 Encounter for immunization: Secondary | ICD-10-CM | POA: Diagnosis not present

## 2019-06-09 ENCOUNTER — Other Ambulatory Visit: Payer: Self-pay | Admitting: Pharmacist

## 2019-06-09 NOTE — Patient Outreach (Signed)
[place] [facility]) Care Management  [place] 06/09/2019  Gabrielle Randall [birth year] [id number]  Incoming call and voicemail from Dr. Ival Bible office to inquire about best way to contact [name] regarding Ozempic dose change.  Provided office with phone number to [name] patient assistance program and recommended that new prescription be called in.  Office staff will do this today.   Will keep [name] case closed.   Ralene Bathe, PharmD, [place] [phone]

## 2019-07-11 DIAGNOSIS — E785 Hyperlipidemia, unspecified: Secondary | ICD-10-CM | POA: Diagnosis not present

## 2019-07-11 DIAGNOSIS — E119 Type 2 diabetes mellitus without complications: Secondary | ICD-10-CM | POA: Diagnosis not present

## 2019-07-12 DIAGNOSIS — E119 Type 2 diabetes mellitus without complications: Secondary | ICD-10-CM | POA: Diagnosis not present

## 2019-07-12 DIAGNOSIS — E782 Mixed hyperlipidemia: Secondary | ICD-10-CM | POA: Diagnosis not present

## 2019-07-12 DIAGNOSIS — I10 Essential (primary) hypertension: Secondary | ICD-10-CM | POA: Diagnosis not present

## 2019-07-12 DIAGNOSIS — J441 Chronic obstructive pulmonary disease with (acute) exacerbation: Secondary | ICD-10-CM | POA: Diagnosis not present

## 2019-08-09 DIAGNOSIS — Z03818 Encounter for observation for suspected exposure to other biological agents ruled out: Secondary | ICD-10-CM | POA: Diagnosis not present

## 2019-08-09 DIAGNOSIS — Z20828 Contact with and (suspected) exposure to other viral communicable diseases: Secondary | ICD-10-CM | POA: Diagnosis not present

## 2019-09-26 DIAGNOSIS — I10 Essential (primary) hypertension: Secondary | ICD-10-CM | POA: Diagnosis not present

## 2019-09-26 DIAGNOSIS — E119 Type 2 diabetes mellitus without complications: Secondary | ICD-10-CM | POA: Diagnosis not present

## 2019-09-26 DIAGNOSIS — E782 Mixed hyperlipidemia: Secondary | ICD-10-CM | POA: Diagnosis not present

## 2019-09-29 DIAGNOSIS — E119 Type 2 diabetes mellitus without complications: Secondary | ICD-10-CM | POA: Diagnosis not present

## 2019-09-29 DIAGNOSIS — Z6836 Body mass index (BMI) 36.0-36.9, adult: Secondary | ICD-10-CM | POA: Diagnosis not present

## 2019-09-29 DIAGNOSIS — I10 Essential (primary) hypertension: Secondary | ICD-10-CM | POA: Diagnosis not present

## 2019-09-29 DIAGNOSIS — E782 Mixed hyperlipidemia: Secondary | ICD-10-CM | POA: Diagnosis not present

## 2019-10-08 ENCOUNTER — Ambulatory Visit: Payer: PPO | Attending: Internal Medicine

## 2019-10-08 DIAGNOSIS — Z23 Encounter for immunization: Secondary | ICD-10-CM

## 2019-10-08 NOTE — Progress Notes (Signed)
   Covid-19 Vaccination Clinic  Name:  Gabrielle Randall    MRN: [id number] DOB: 06-18-1946  10/08/2019  Gabrielle Randall was observed post Covid-19 immunization for 15 minutes without incidence. She was provided with Vaccine Information Sheet and instruction to access the V-Safe system.   Gabrielle Randall was instructed to call 911 with any severe reactions post vaccine: Marland Kitchen Difficulty breathing  . Swelling of your face and throat  . A fast heartbeat  . A bad rash all over your body  . Dizziness and weakness    Immunizations Administered    Name Date Dose VIS Date Route   Pfizer COVID-19 Vaccine 10/08/2019  8:28 AM 0.3 mL 08/05/2019 Intramuscular   Manufacturer: [employer], [employer]   Lot: [id number]   NDC: [address]-4

## 2019-10-30 ENCOUNTER — Ambulatory Visit: Payer: PPO | Attending: Internal Medicine

## 2019-10-30 DIAGNOSIS — Z23 Encounter for immunization: Secondary | ICD-10-CM

## 2019-10-30 NOTE — Progress Notes (Signed)
   Covid-19 Vaccination Clinic  Name:  Gabrielle Randall    MRN: [id number] DOB: 12/27/1945  10/30/2019  [name] was observed post Covid-19 immunization for 15 minutes without incident. She was provided with Vaccine Information Sheet and instruction to access the V-Safe system.   [name] was instructed to call 911 with any severe reactions post vaccine: Marland Kitchen Difficulty breathing  . Swelling of face and throat  . A fast heartbeat  . A bad rash all over body  . Dizziness and weakness   Immunizations Administered    Name Date Dose VIS Date Route   Pfizer COVID-19 Vaccine 10/30/2019 12:28 PM 0.3 mL 08/05/2019 Intramuscular   Manufacturer: [employer], [employer]   Lot: [id number]   NDC: [address]-1

## 2020-02-03 DIAGNOSIS — E559 Vitamin D deficiency, unspecified: Secondary | ICD-10-CM | POA: Diagnosis not present

## 2020-02-03 DIAGNOSIS — E039 Hypothyroidism, unspecified: Secondary | ICD-10-CM | POA: Diagnosis not present

## 2020-02-03 DIAGNOSIS — E119 Type 2 diabetes mellitus without complications: Secondary | ICD-10-CM | POA: Diagnosis not present

## 2020-02-03 DIAGNOSIS — E782 Mixed hyperlipidemia: Secondary | ICD-10-CM | POA: Diagnosis not present

## 2020-02-14 DIAGNOSIS — I10 Essential (primary) hypertension: Secondary | ICD-10-CM | POA: Diagnosis not present

## 2020-02-14 DIAGNOSIS — K219 Gastro-esophageal reflux disease without esophagitis: Secondary | ICD-10-CM | POA: Diagnosis not present

## 2020-02-14 DIAGNOSIS — E1165 Type 2 diabetes mellitus with hyperglycemia: Secondary | ICD-10-CM | POA: Diagnosis not present

## 2020-02-14 DIAGNOSIS — E669 Obesity, unspecified: Secondary | ICD-10-CM | POA: Diagnosis not present

## 2020-02-14 DIAGNOSIS — E559 Vitamin D deficiency, unspecified: Secondary | ICD-10-CM | POA: Diagnosis not present

## 2020-02-14 DIAGNOSIS — E782 Mixed hyperlipidemia: Secondary | ICD-10-CM | POA: Diagnosis not present

## 2020-03-20 ENCOUNTER — Other Ambulatory Visit: Payer: Self-pay

## 2020-03-26 DIAGNOSIS — Z1339 Encounter for screening examination for other mental health and behavioral disorders: Secondary | ICD-10-CM | POA: Diagnosis not present

## 2020-03-26 DIAGNOSIS — Z1331 Encounter for screening for depression: Secondary | ICD-10-CM | POA: Diagnosis not present

## 2020-03-26 DIAGNOSIS — Z0000 Encounter for general adult medical examination without abnormal findings: Secondary | ICD-10-CM | POA: Diagnosis not present

## 2020-03-26 DIAGNOSIS — M8580 Other specified disorders of bone density and structure, unspecified site: Secondary | ICD-10-CM | POA: Diagnosis not present

## 2020-03-27 ENCOUNTER — Other Ambulatory Visit: Payer: Self-pay | Admitting: Family Medicine

## 2020-03-27 DIAGNOSIS — E559 Vitamin D deficiency, unspecified: Secondary | ICD-10-CM

## 2020-03-27 DIAGNOSIS — M8580 Other specified disorders of bone density and structure, unspecified site: Secondary | ICD-10-CM

## 2020-03-27 DIAGNOSIS — Z1231 Encounter for screening mammogram for malignant neoplasm of breast: Secondary | ICD-10-CM

## 2020-05-11 DIAGNOSIS — E1165 Type 2 diabetes mellitus with hyperglycemia: Secondary | ICD-10-CM | POA: Diagnosis not present

## 2020-05-11 DIAGNOSIS — E039 Hypothyroidism, unspecified: Secondary | ICD-10-CM | POA: Diagnosis not present

## 2020-05-11 DIAGNOSIS — E785 Hyperlipidemia, unspecified: Secondary | ICD-10-CM | POA: Diagnosis not present

## 2020-05-11 DIAGNOSIS — I10 Essential (primary) hypertension: Secondary | ICD-10-CM | POA: Diagnosis not present

## 2020-05-14 DIAGNOSIS — E1165 Type 2 diabetes mellitus with hyperglycemia: Secondary | ICD-10-CM | POA: Diagnosis not present

## 2020-05-14 DIAGNOSIS — I10 Essential (primary) hypertension: Secondary | ICD-10-CM | POA: Diagnosis not present

## 2020-05-14 DIAGNOSIS — E782 Mixed hyperlipidemia: Secondary | ICD-10-CM | POA: Diagnosis not present

## 2020-05-14 DIAGNOSIS — E039 Hypothyroidism, unspecified: Secondary | ICD-10-CM | POA: Diagnosis not present

## 2020-06-01 DIAGNOSIS — Z23 Encounter for immunization: Secondary | ICD-10-CM | POA: Diagnosis not present

## 2020-06-27 ENCOUNTER — Ambulatory Visit
Admission: RE | Admit: 2020-06-27 | Discharge: 2020-06-27 | Disposition: A | Discharge status: Home / Self Care | Payer: PPO | Source: Ambulatory Visit | Attending: Family Medicine | Admitting: Family Medicine

## 2020-06-27 ENCOUNTER — Other Ambulatory Visit: Payer: Self-pay

## 2020-06-27 DIAGNOSIS — Z1231 Encounter for screening mammogram for malignant neoplasm of breast: Secondary | ICD-10-CM

## 2020-06-27 DIAGNOSIS — M8580 Other specified disorders of bone density and structure, unspecified site: Secondary | ICD-10-CM

## 2020-06-27 DIAGNOSIS — Z780 Asymptomatic menopausal state: Secondary | ICD-10-CM | POA: Diagnosis not present

## 2020-06-27 DIAGNOSIS — E559 Vitamin D deficiency, unspecified: Secondary | ICD-10-CM

## 2020-07-02 DIAGNOSIS — M1990 Unspecified osteoarthritis, unspecified site: Secondary | ICD-10-CM | POA: Diagnosis not present

## 2020-07-02 DIAGNOSIS — E559 Vitamin D deficiency, unspecified: Secondary | ICD-10-CM | POA: Diagnosis not present

## 2020-07-02 DIAGNOSIS — M8580 Other specified disorders of bone density and structure, unspecified site: Secondary | ICD-10-CM | POA: Diagnosis not present

## 2020-07-04 DIAGNOSIS — M1711 Unilateral primary osteoarthritis, right knee: Secondary | ICD-10-CM | POA: Diagnosis not present

## 2020-07-04 DIAGNOSIS — M170 Bilateral primary osteoarthritis of knee: Secondary | ICD-10-CM | POA: Diagnosis not present

## 2020-07-04 DIAGNOSIS — M1712 Unilateral primary osteoarthritis, left knee: Secondary | ICD-10-CM | POA: Diagnosis not present

## 2020-09-03 DIAGNOSIS — E1165 Type 2 diabetes mellitus with hyperglycemia: Secondary | ICD-10-CM | POA: Diagnosis not present

## 2020-09-03 DIAGNOSIS — E782 Mixed hyperlipidemia: Secondary | ICD-10-CM | POA: Diagnosis not present

## 2020-09-03 DIAGNOSIS — E559 Vitamin D deficiency, unspecified: Secondary | ICD-10-CM | POA: Diagnosis not present

## 2020-09-10 DIAGNOSIS — E1165 Type 2 diabetes mellitus with hyperglycemia: Secondary | ICD-10-CM | POA: Diagnosis not present

## 2020-09-10 DIAGNOSIS — M791 Myalgia, unspecified site: Secondary | ICD-10-CM | POA: Diagnosis not present

## 2020-09-10 DIAGNOSIS — T466X5A Adverse effect of antihyperlipidemic and antiarteriosclerotic drugs, initial encounter: Secondary | ICD-10-CM | POA: Diagnosis not present

## 2020-09-10 DIAGNOSIS — E782 Mixed hyperlipidemia: Secondary | ICD-10-CM | POA: Diagnosis not present

## 2020-09-10 DIAGNOSIS — E559 Vitamin D deficiency, unspecified: Secondary | ICD-10-CM | POA: Diagnosis not present

## 2020-09-10 DIAGNOSIS — I10 Essential (primary) hypertension: Secondary | ICD-10-CM | POA: Diagnosis not present

## 2020-09-10 DIAGNOSIS — Z789 Other specified health status: Secondary | ICD-10-CM | POA: Diagnosis not present

## 2020-10-11 DIAGNOSIS — J449 Chronic obstructive pulmonary disease, unspecified: Secondary | ICD-10-CM | POA: Diagnosis not present

## 2020-10-11 DIAGNOSIS — E1165 Type 2 diabetes mellitus with hyperglycemia: Secondary | ICD-10-CM | POA: Diagnosis not present

## 2020-10-11 DIAGNOSIS — I10 Essential (primary) hypertension: Secondary | ICD-10-CM | POA: Diagnosis not present

## 2020-10-11 DIAGNOSIS — J441 Chronic obstructive pulmonary disease with (acute) exacerbation: Secondary | ICD-10-CM | POA: Diagnosis not present

## 2020-11-12 DIAGNOSIS — I152 Hypertension secondary to endocrine disorders: Secondary | ICD-10-CM | POA: Diagnosis not present

## 2020-11-12 DIAGNOSIS — E1169 Type 2 diabetes mellitus with other specified complication: Secondary | ICD-10-CM | POA: Diagnosis not present

## 2020-11-12 DIAGNOSIS — E1165 Type 2 diabetes mellitus with hyperglycemia: Secondary | ICD-10-CM | POA: Diagnosis not present

## 2020-11-12 DIAGNOSIS — J441 Chronic obstructive pulmonary disease with (acute) exacerbation: Secondary | ICD-10-CM | POA: Diagnosis not present

## 2020-11-12 DIAGNOSIS — E782 Mixed hyperlipidemia: Secondary | ICD-10-CM | POA: Diagnosis not present

## 2020-11-12 DIAGNOSIS — E1159 Type 2 diabetes mellitus with other circulatory complications: Secondary | ICD-10-CM | POA: Diagnosis not present

## 2020-11-12 DIAGNOSIS — I10 Essential (primary) hypertension: Secondary | ICD-10-CM | POA: Diagnosis not present

## 2020-11-22 DIAGNOSIS — E1165 Type 2 diabetes mellitus with hyperglycemia: Secondary | ICD-10-CM | POA: Diagnosis not present

## 2020-11-22 DIAGNOSIS — I10 Essential (primary) hypertension: Secondary | ICD-10-CM | POA: Diagnosis not present

## 2020-12-18 DIAGNOSIS — E782 Mixed hyperlipidemia: Secondary | ICD-10-CM | POA: Diagnosis not present

## 2020-12-18 DIAGNOSIS — E039 Hypothyroidism, unspecified: Secondary | ICD-10-CM | POA: Diagnosis not present

## 2020-12-18 DIAGNOSIS — E1165 Type 2 diabetes mellitus with hyperglycemia: Secondary | ICD-10-CM | POA: Diagnosis not present

## 2020-12-20 DIAGNOSIS — Z7185 Encounter for immunization safety counseling: Secondary | ICD-10-CM | POA: Diagnosis not present

## 2020-12-20 DIAGNOSIS — E782 Mixed hyperlipidemia: Secondary | ICD-10-CM | POA: Diagnosis not present

## 2020-12-20 DIAGNOSIS — E039 Hypothyroidism, unspecified: Secondary | ICD-10-CM | POA: Diagnosis not present

## 2020-12-20 DIAGNOSIS — I152 Hypertension secondary to endocrine disorders: Secondary | ICD-10-CM | POA: Diagnosis not present

## 2020-12-20 DIAGNOSIS — E1165 Type 2 diabetes mellitus with hyperglycemia: Secondary | ICD-10-CM | POA: Diagnosis not present

## 2020-12-25 DIAGNOSIS — Z23 Encounter for immunization: Secondary | ICD-10-CM | POA: Diagnosis not present

## 2021-02-14 DIAGNOSIS — E039 Hypothyroidism, unspecified: Secondary | ICD-10-CM | POA: Diagnosis not present

## 2021-02-18 DIAGNOSIS — E1165 Type 2 diabetes mellitus with hyperglycemia: Secondary | ICD-10-CM | POA: Diagnosis not present

## 2021-02-18 DIAGNOSIS — E039 Hypothyroidism, unspecified: Secondary | ICD-10-CM | POA: Diagnosis not present

## 2021-03-28 DIAGNOSIS — Z1339 Encounter for screening examination for other mental health and behavioral disorders: Secondary | ICD-10-CM | POA: Diagnosis not present

## 2021-03-28 DIAGNOSIS — Z1331 Encounter for screening for depression: Secondary | ICD-10-CM | POA: Diagnosis not present

## 2021-03-28 DIAGNOSIS — Z0000 Encounter for general adult medical examination without abnormal findings: Secondary | ICD-10-CM | POA: Diagnosis not present

## 2021-04-04 DIAGNOSIS — I10 Essential (primary) hypertension: Secondary | ICD-10-CM | POA: Diagnosis not present

## 2021-04-04 DIAGNOSIS — E785 Hyperlipidemia, unspecified: Secondary | ICD-10-CM | POA: Diagnosis not present

## 2021-04-04 DIAGNOSIS — E039 Hypothyroidism, unspecified: Secondary | ICD-10-CM | POA: Diagnosis not present

## 2021-04-04 DIAGNOSIS — E1165 Type 2 diabetes mellitus with hyperglycemia: Secondary | ICD-10-CM | POA: Diagnosis not present

## 2021-04-09 DIAGNOSIS — E1165 Type 2 diabetes mellitus with hyperglycemia: Secondary | ICD-10-CM | POA: Diagnosis not present

## 2021-04-09 DIAGNOSIS — I10 Essential (primary) hypertension: Secondary | ICD-10-CM | POA: Diagnosis not present

## 2021-04-09 DIAGNOSIS — E039 Hypothyroidism, unspecified: Secondary | ICD-10-CM | POA: Diagnosis not present

## 2021-04-09 DIAGNOSIS — E782 Mixed hyperlipidemia: Secondary | ICD-10-CM | POA: Diagnosis not present

## 2021-05-20 DIAGNOSIS — Z23 Encounter for immunization: Secondary | ICD-10-CM | POA: Diagnosis not present

## 2021-05-30 DIAGNOSIS — Z23 Encounter for immunization: Secondary | ICD-10-CM | POA: Diagnosis not present

## 2021-08-21 ENCOUNTER — Encounter: Payer: Self-pay | Admitting: Pulmonary Disease

## 2021-08-21 ENCOUNTER — Other Ambulatory Visit: Payer: Self-pay

## 2021-08-21 ENCOUNTER — Ambulatory Visit: Payer: PPO | Admitting: Pulmonary Disease

## 2021-08-21 VITALS — BP 130/74 | HR 88 | Ht 60.0 in | Wt 201.2 lb

## 2021-08-21 DIAGNOSIS — J4540 Moderate persistent asthma, uncomplicated: Secondary | ICD-10-CM

## 2021-08-21 DIAGNOSIS — R0602 Shortness of breath: Secondary | ICD-10-CM | POA: Diagnosis not present

## 2021-08-21 DIAGNOSIS — R0683 Snoring: Secondary | ICD-10-CM

## 2021-08-21 NOTE — Progress Notes (Signed)
Synopsis: Referred in December 2022 for shortness of breath by Maryelizabeth Rowan, MD  Subjective:   PATIENT ID: Gabrielle Randall GENDER: female DOB: Sep 12, 1945, MRN: [id number]   HPI  Chief Complaint  Patient presents with   Consult    Self referral for COPD. States she was diagnosed about 25 years ago. Her SOB has increased over the past few months.    Gabrielle Randall is a 75 year old woman, former smoker with history of asthma, obesity, DMII and hypertension who is referred to pulmonary clinic for evaluation of COPD.   Patient reports progressive shortness of breath over the past year.  She now experiences dyspnea when climbing the stairs in her home to her bedroom where she needs to rest for 4 to 5 minutes.  She also notices dyspnea walking to the end of her driveway which is about 3 car length long.  She also notices dyspnea when carrying her groceries.  She does report wheezing with the shortness of breath.  She does report nighttime awakenings due to dyspnea.  She sleeps upright and an adjustable base bed as she is not able to lay flat for long periods of time as it is harder for her to breathe.  She does report history of snoring.  She has never had a sleep study in the past.  She has an as needed albuterol inhaler which does relieve her symptoms.  She is currently using Symbicort 160-4.5 MCG 2 puffs in the morning time.  She has a harder time breathing in the wintertime due to the cold weather as well as on hot humid summer days.  Strong perfumes and colognes along with certain cleaning agents bother her breathing.  She quit smoking in 2007 and was smoking about 0.75 packs/day since the age of 75.  She reports history of multiple pneumonias.  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  Her maternal grandmother had lung disease.  She currently lives with her husband and 2 grandsons.   No past medical history  on file.   No family history on file.   Social History   Socioeconomic History   Marital status: Married    Spouse name: Not on file   Number of children: Not on file   Years of education: Not on file   Highest education level: Not on file  Occupational History   Not on file  Tobacco Use   Smoking status: Former    Packs/day: 1.00    Types: Cigarettes    Start date: 06/30/1962    Quit date: 08/25/2005    Years since quitting: 16.0   Smokeless tobacco: Never  Substance and Sexual Activity   Alcohol use: Not on file   Drug use: Not on file   Sexual activity: Not on file  Other Topics Concern   Not on file  Social History Narrative   Not on file   Social Determinants of Health   Financial Resource Strain: Not on file  Food Insecurity: Not on file  Transportation Needs: Not on file  Physical Activity: Not on file  Stress: Not on file  Social Connections: Not on file  Intimate Partner Violence: Not on file     Allergies  Allergen Reactions   Codeine Nausea And Vomiting     Outpatient Medications Prior to Visit  Medication Sig Dispense Refill   acetaminophen (TYLENOL) 650 MG CR tablet Take 650 mg by mouth every 8 (  eight) hours as needed for pain.     albuterol (VENTOLIN HFA) 108 (90 Base) MCG/ACT inhaler Inhale 1-2 puffs into the lungs every 6 (six) hours as needed for wheezing or shortness of breath.     amLODipine (NORVASC) 5 MG tablet Take 5 mg by mouth daily.     budesonide-formoterol (SYMBICORT) 160-4.5 MCG/ACT inhaler Inhale 2 puffs into the lungs 2 (two) times daily. Taking 1x daily     INVOKAMET XR 150-500 MG TB24 Take 2 tablets by mouth daily.     levothyroxine (SYNTHROID) 125 MCG tablet Take 125 mcg by mouth every morning.     cholecalciferol (VITAMIN D3) 25 MCG (1000 UT) tablet Take 3,000 Units by mouth daily.     insulin glargine (LANTUS) 100 UNIT/ML injection Inject 10 Units into the skin daily.     losartan-hydrochlorothiazide (HYZAAR) 100-25 MG tablet  Take 1 tablet by mouth daily.     pantoprazole (PROTONIX) 20 MG tablet Take 20 mg by mouth daily as needed for heartburn or indigestion.     albuterol (PROVENTIL) (5 MG/ML) 0.5% nebulizer solution Take 2.5 mg by nebulization every 6 (six) hours as needed for wheezing or shortness of breath.     levothyroxine (SYNTHROID) 75 MCG tablet Take 75 mcg by mouth daily before breakfast.     SitaGLIPtin-MetFORMIN HCl [phone] MG TB24 Take 1 tablet by mouth daily after breakfast.     No facility-administered medications prior to visit.   Review of Systems  Constitutional:  Negative for chills, fever, malaise/fatigue and weight loss.  HENT:  Positive for congestion. Negative for sinus pain and sore throat.   Eyes: Negative.   Respiratory:  Positive for shortness of breath and wheezing. Negative for cough, hemoptysis and sputum production.   Cardiovascular:  Negative for chest pain, palpitations, orthopnea, claudication and leg swelling.  Gastrointestinal:  Negative for abdominal pain, heartburn, nausea and vomiting.  Genitourinary: Negative.   Musculoskeletal:  Negative for joint pain and myalgias.  Skin:  Negative for rash.  Neurological:  Negative for weakness.  Endo/Heme/Allergies: Negative.   Psychiatric/Behavioral: Negative.     Objective:   Vitals:   08/21/21 1618  BP: 130/74  Pulse: 88  SpO2: 95%  Weight: 201 lb 3.2 oz (91.3 kg)  Height: 5' (1.524 m)    Physical Exam Constitutional:      General: She is not in acute distress.    Appearance: She is obese. She is not ill-appearing.  HENT:     Head: Normocephalic and atraumatic.  Eyes:     General: No scleral icterus.    Conjunctiva/sclera: Conjunctivae normal.     Pupils: Pupils are equal, round, and reactive to light.  Cardiovascular:     Rate and Rhythm: Normal rate and regular rhythm.     Pulses: Normal pulses.     Heart sounds: Normal heart sounds. No murmur heard. Pulmonary:     Effort: Pulmonary effort is normal.      Breath sounds: Normal breath sounds. No wheezing, rhonchi or rales.  Abdominal:     General: Bowel sounds are normal.     Palpations: Abdomen is soft.  Musculoskeletal:     Right lower leg: No edema.     Left lower leg: No edema.  Lymphadenopathy:     Cervical: No cervical adenopathy.  Skin:    General: Skin is warm and dry.  Neurological:     General: No focal deficit present.     Mental Status: She is alert.  Psychiatric:  Mood and Affect: Mood normal.        Behavior: Behavior normal.        Thought Content: Thought content normal.        Judgment: Judgment normal.   CBC No results found for: WBC, RBC, HGB, HCT, PLT, MCV, MCH, MCHC, RDW, LYMPHSABS, MONOABS, EOSABS, BASOSABS  No flowsheet data found.  Chest imaging:  PFT: No flowsheet data found.  Labs:  Path:  Echo:  Heart Catheterization:  Assessment & Plan:   Moderate persistent asthma without complication - Plan: Pulmonary Function Test  Shortness of breath  Snoring - Plan: Home sleep test  Discussion: Gabrielle Randall is a 75 year old woman, former smoker with history of asthma, obesity, DMII and hypertension who is referred to pulmonary clinic for evaluation of COPD.   Patient's history is concerning for obstructive lung disease with possible moderate persistent asthma versus COPD.  She is to use Symbicort inhaler 2 puffs twice daily along with as needed albuterol inhaler.  We will consider adding LAMA inhaler therapy if needed in the future.  She also has concerning clinical history for obstructive sleep apnea given her obesity, nighttime awakenings and history of snoring.  We will check a home sleep study.  She is to follow-up in 6 weeks with pulmonary function test.  Melody Comas, MD [place] Office: [phone]   Current Outpatient Medications:    acetaminophen (TYLENOL) 650 MG CR tablet, Take 650 mg by mouth every 8 (eight) hours as needed for pain., Disp: ,  Rfl:    albuterol (VENTOLIN HFA) 108 (90 Base) MCG/ACT inhaler, Inhale 1-2 puffs into the lungs every 6 (six) hours as needed for wheezing or shortness of breath., Disp: , Rfl:    amLODipine (NORVASC) 5 MG tablet, Take 5 mg by mouth daily., Disp: , Rfl:    budesonide-formoterol (SYMBICORT) 160-4.5 MCG/ACT inhaler, Inhale 2 puffs into the lungs 2 (two) times daily. Taking 1x daily, Disp: , Rfl:    INVOKAMET XR 150-500 MG TB24, Take 2 tablets by mouth daily., Disp: , Rfl:    levothyroxine (SYNTHROID) 125 MCG tablet, Take 125 mcg by mouth every morning., Disp: , Rfl:

## 2021-08-21 NOTE — Patient Instructions (Addendum)
Use symbicort 2 puffs in the AM and the PM  Use albuterol as needed  We will order a home sleep study  Follow up in 6 weeks for pulmonary function tests

## 2021-08-23 ENCOUNTER — Encounter: Payer: Self-pay | Admitting: Pulmonary Disease

## 2021-09-13 ENCOUNTER — Ambulatory Visit (INDEPENDENT_AMBULATORY_CARE_PROVIDER_SITE_OTHER): Payer: PPO

## 2021-09-13 ENCOUNTER — Other Ambulatory Visit: Payer: Self-pay

## 2021-09-13 DIAGNOSIS — G4733 Obstructive sleep apnea (adult) (pediatric): Secondary | ICD-10-CM

## 2021-09-13 DIAGNOSIS — R0683 Snoring: Secondary | ICD-10-CM

## 2021-09-16 ENCOUNTER — Telehealth: Payer: Self-pay | Admitting: Pulmonary Disease

## 2021-09-16 DIAGNOSIS — R0683 Snoring: Secondary | ICD-10-CM

## 2021-09-16 DIAGNOSIS — E559 Vitamin D deficiency, unspecified: Secondary | ICD-10-CM | POA: Diagnosis not present

## 2021-09-16 DIAGNOSIS — G4733 Obstructive sleep apnea (adult) (pediatric): Secondary | ICD-10-CM | POA: Diagnosis not present

## 2021-09-16 DIAGNOSIS — E1165 Type 2 diabetes mellitus with hyperglycemia: Secondary | ICD-10-CM | POA: Diagnosis not present

## 2021-09-16 DIAGNOSIS — I10 Essential (primary) hypertension: Secondary | ICD-10-CM | POA: Diagnosis not present

## 2021-09-16 NOTE — Telephone Encounter (Signed)
Call patient  FYI: Dr. Francine Graven  Sleep study result  Date of study: 09/13/2021  Impression: Moderate obstructive sleep apnea Moderate oxygen desaturations  Recommendation: DME referral  Recommend CPAP therapy for moderate obstructive sleep apnea  Auto titrating CPAP with pressure settings of 5-15 will be appropriate  Encourage weight loss measures  Follow-up in the office 4 to 6 weeks following initiation of treatment

## 2021-09-18 NOTE — Telephone Encounter (Signed)
I called the patient and she is agreeable to the CPAP therapy and she voices understanding. She did not have any questions. I have placed the order for the CPAP.

## 2021-09-18 NOTE — Addendum Note (Signed)
Addended by: Arvilla Market on: 09/18/2021 12:40 PM   Modules accepted: Orders

## 2021-09-19 DIAGNOSIS — E1165 Type 2 diabetes mellitus with hyperglycemia: Secondary | ICD-10-CM | POA: Diagnosis not present

## 2021-09-19 DIAGNOSIS — I10 Essential (primary) hypertension: Secondary | ICD-10-CM | POA: Diagnosis not present

## 2021-09-19 DIAGNOSIS — E559 Vitamin D deficiency, unspecified: Secondary | ICD-10-CM | POA: Diagnosis not present

## 2021-10-07 ENCOUNTER — Encounter: Payer: Self-pay | Admitting: Pulmonary Disease

## 2021-10-07 ENCOUNTER — Ambulatory Visit: Payer: PPO | Admitting: Pulmonary Disease

## 2021-10-07 ENCOUNTER — Ambulatory Visit (INDEPENDENT_AMBULATORY_CARE_PROVIDER_SITE_OTHER): Payer: PPO | Admitting: Pulmonary Disease

## 2021-10-07 ENCOUNTER — Other Ambulatory Visit: Payer: Self-pay

## 2021-10-07 ENCOUNTER — Ambulatory Visit (INDEPENDENT_AMBULATORY_CARE_PROVIDER_SITE_OTHER): Payer: PPO

## 2021-10-07 VITALS — BP 128/72 | HR 93 | Ht 60.5 in | Wt 201.0 lb

## 2021-10-07 DIAGNOSIS — J4540 Moderate persistent asthma, uncomplicated: Secondary | ICD-10-CM

## 2021-10-07 DIAGNOSIS — R0601 Orthopnea: Secondary | ICD-10-CM

## 2021-10-07 DIAGNOSIS — J449 Chronic obstructive pulmonary disease, unspecified: Secondary | ICD-10-CM

## 2021-10-07 DIAGNOSIS — R0602 Shortness of breath: Secondary | ICD-10-CM | POA: Diagnosis not present

## 2021-10-07 DIAGNOSIS — G4733 Obstructive sleep apnea (adult) (pediatric): Secondary | ICD-10-CM | POA: Diagnosis not present

## 2021-10-07 DIAGNOSIS — R0609 Other forms of dyspnea: Secondary | ICD-10-CM | POA: Diagnosis not present

## 2021-10-07 MED ORDER — SPIRIVA RESPIMAT 2.5 MCG/ACT IN AERS: 2.0000 | INHALATION_SPRAY | Freq: Every day | RESPIRATORY_TRACT | 0 refills | Status: DC

## 2021-10-07 NOTE — Patient Instructions (Signed)
Continue symbicort inhaler 2 puffs twice daily - Rinse mouth out after each use  Start Spiriva inhaler 2 puffs daily  If you notice improvement in your symptoms with the addition of spiriva, please let us know and we will send in a prescription.   We will check a chest x-ray today  We will refer you to cardiology for further evaluation of your shortness of breath

## 2021-10-07 NOTE — Progress Notes (Signed)
Synopsis: Referred in December 2022 for shortness of breath by Maryelizabeth Rowan, MD  Subjective:   PATIENT ID: Gabrielle Randall GENDER: female DOB: 09/17/1945, MRN: [id number]   HPI  Chief Complaint  Patient presents with   Follow-up    F/U after PFT   Gabrielle Randall is a 76 year old woman, former smoker with history of asthma, obesity, DMII and hypertension who returns to pulmonary clinic for COPD.   Her shortness of breath has improved with using symbicort scheduled 2 puffs twice daily. She continues to have exertional dyspnea with exertion such has carrying grocery bags. She is able to walk up her steps now without stopping but has significant dyspnea. She notices intermittent wheezing at rest. She has orthopnea.   Pulmonary function test shows non-specific pattern with reduced FEV1 and FVC with normalized ratio after bronchodilation and normal TLC. Her DLCO is normal.   Home sleep study 09/16/21 showed moderate obstructive sleep apnea with AHI of 24.3/hr. She has been ordered for CPAP therapy with auto-titration to start.  OV 08/21/21 Patient reports progressive shortness of breath over the past year.  She now experiences dyspnea when climbing the stairs in her home to her bedroom where she needs to rest for 4 to 5 minutes.  She also notices dyspnea walking to the end of her driveway which is about 3 car length long.  She also notices dyspnea when carrying her groceries.  She does report wheezing with the shortness of breath.  She does report nighttime awakenings due to dyspnea.  She sleeps upright and an adjustable base bed as she is not able to lay flat for long periods of time as it is harder for her to breathe.  She does report history of snoring.  She has never had a sleep study in the past.  She has an as needed albuterol inhaler which does relieve her symptoms.  She is currently using Symbicort 160-4.5 MCG 2 puffs in the morning time.  She has a harder time breathing in the  wintertime due to the cold weather as well as on hot humid summer days.  Strong perfumes and colognes along with certain cleaning agents bother her breathing.  She quit smoking in 2007 and was smoking about 0.75 packs/day since the age of 53.  She reports history of multiple pneumonias.  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  Her maternal grandmother had lung disease.  She currently lives with her husband and 2 grandsons.  Past Medical History:  Diagnosis Date   Asthma    Hypertension    Type II diabetes mellitus (HCC)      History reviewed. No pertinent family history.   Social History   Socioeconomic History   Marital status: Married    Spouse name: Not on file   Number of children: Not on file   Years of education: Not on file   Highest education level: Not on file  Occupational History   Not on file  Tobacco Use   Smoking status: Former    Packs/day: 1.00    Types: Cigarettes    Start date: 06/30/1962    Quit date: 08/25/2005    Years since quitting: 16.1   Smokeless tobacco: Never  Substance and Sexual Activity   Alcohol use: Not on file   Drug use: Not on file   Sexual activity: Not on file  Other Topics Concern   Not on file  Social History Narrative   Not on file   Social Determinants of Health   Financial Resource Strain: Not on file  Food Insecurity: Not on file  Transportation Needs: Not on file  Physical Activity: Not on file  Stress: Not on file  Social Connections: Not on file  Intimate Partner Violence: Not on file     Allergies  Allergen Reactions   Codeine Nausea And Vomiting     Outpatient Medications Prior to Visit  Medication Sig Dispense Refill   acetaminophen (TYLENOL) 650 MG CR tablet Take 650 mg by mouth every 8 (eight) hours as needed for pain.     albuterol (VENTOLIN HFA) 108 (90 Base) MCG/ACT inhaler Inhale 1-2 puffs into the lungs every 6 (six) hours as  needed for wheezing or shortness of breath.     amLODipine (NORVASC) 5 MG tablet Take 5 mg by mouth daily.     budesonide-formoterol (SYMBICORT) 160-4.5 MCG/ACT inhaler Inhale 2 puffs into the lungs 2 (two) times daily. Taking 1x daily     levothyroxine (SYNTHROID) 125 MCG tablet Take 125 mcg by mouth every morning.     RYBELSUS 3 MG TABS Take 1 tablet by mouth daily.     INVOKAMET XR 150-500 MG TB24 Take 2 tablets by mouth daily.     No facility-administered medications prior to visit.   Review of Systems  Constitutional:  Negative for chills, fever, malaise/fatigue and weight loss.  HENT:  Positive for congestion. Negative for sinus pain and sore throat.   Eyes: Negative.   Respiratory:  Positive for shortness of breath and wheezing. Negative for cough, hemoptysis and sputum production.   Cardiovascular:  Negative for chest pain, palpitations, orthopnea, claudication and leg swelling.  Gastrointestinal:  Negative for abdominal pain, heartburn, nausea and vomiting.  Genitourinary: Negative.   Musculoskeletal:  Negative for joint pain and myalgias.  Skin:  Negative for rash.  Neurological:  Negative for weakness.  Endo/Heme/Allergies: Negative.   Psychiatric/Behavioral: Negative.     Objective:   Vitals:   10/07/21 1350  BP: 128/72  Pulse: 93  SpO2: 97%  Weight: 201 lb (91.2 kg)  Height: 5' 0.5" (1.537 m)    Physical Exam Constitutional:      General: She is not in acute distress.    Appearance: She is obese. She is not ill-appearing.  HENT:     Head: Normocephalic and atraumatic.  Eyes:     General: No scleral icterus.    Conjunctiva/sclera: Conjunctivae normal.  Cardiovascular:     Rate and Rhythm: Normal rate and regular rhythm.     Pulses: Normal pulses.     Heart sounds: Normal heart sounds. No murmur heard. Pulmonary:     Effort: Pulmonary effort is normal.     Breath sounds: Normal breath sounds. No wheezing, rhonchi or rales.  Musculoskeletal:     Right  lower leg: No edema.     Left lower leg: No edema.  Skin:    General: Skin is warm and dry.  Neurological:     General: No focal deficit present.     Mental Status: She is alert.  Psychiatric:        Mood and Affect: Mood normal.        Behavior: Behavior normal.        Thought Content: Thought content normal.        Judgment: Judgment normal.   CBC No results found for: WBC, RBC, HGB, HCT, PLT, MCV, MCH, MCHC, RDW, LYMPHSABS, MONOABS, EOSABS,  BASOSABS  No flowsheet data found.  Chest imaging: CXR 10/07/21 Cardiomediastinal silhouette unchanged in size and contour.   Improved aeration at the lung bases from the prior, with no new confluent airspace disease. No pneumothorax or pleural effusion.   Coarsened interstitial markings.  No interlobular septal thickening.   Degenerative changes spine.  No acute displaced fracture  PFT: PFT Results Latest Ref Rng & Units 10/07/2021  FVC-Pre L 1.60  FVC-Predicted Pre % 64  FVC-Post L 1.54  FVC-Predicted Post % 62  Pre FEV1/FVC % % 66  Post FEV1/FCV % % 73  FEV1-Pre L 1.06  FEV1-Predicted Pre % 57  FEV1-Post L 1.12  DLCO uncorrected ml/min/mmHg 16.52  DLCO UNC% % 96  DLCO corrected ml/min/mmHg 16.52  DLCO COR %Predicted % 96  DLVA Predicted % 127  TLC L 4.45  TLC % Predicted % 98  RV % Predicted % 116    Labs:  Path:  Echo:  Heart Catheterization:  HST 09/17/21 AHI 24.3/hr, moderate obstructive sleep apnea  Assessment & Plan:   Asthma-COPD overlap syndrome (HCC) - Plan: DG Chest 2 View  OSA (obstructive sleep apnea)  Dyspnea on exertion - Plan: Ambulatory referral to Cardiology  Orthopnea - Plan: Ambulatory referral to Cardiology  Discussion: Gabrielle Randall is a 76 year old woman, former smoker with history of asthma, obesity, DMII and hypertension who returns to pulmonary clinic for COPD.    She has non-specific PFT pattern with reduced FEV1 and FVC but normal TLC. She does have reversible ratio with  bronchodilator therapy. I believe her symptoms and PFT pattern are most consistent with asthma-copd overlap syndrome. She is to continue symbicort 2 puffs twice daily. We will add spiriva 2.71mcg 2 puffs daily to her regimen and monitor for further benefit.   She is awaiting her CPAP machine for her recent diagnosis of moderate obstructive sleep apnea.   We will refer her to cardiology for further evaluation of dyspnea and orthopnea.   Follow up in 6 months.   Melody Comas, MD [facility] Office: [phone]   Current Outpatient Medications:    acetaminophen (TYLENOL) 650 MG CR tablet, Take 650 mg by mouth every 8 (eight) hours as needed for pain., Disp: , Rfl:    albuterol (VENTOLIN HFA) 108 (90 Base) MCG/ACT inhaler, Inhale 1-2 puffs into the lungs every 6 (six) hours as needed for wheezing or shortness of breath., Disp: , Rfl:    amLODipine (NORVASC) 5 MG tablet, Take 5 mg by mouth daily., Disp: , Rfl:    budesonide-formoterol (SYMBICORT) 160-4.5 MCG/ACT inhaler, Inhale 2 puffs into the lungs 2 (two) times daily. Taking 1x daily, Disp: , Rfl:    levothyroxine (SYNTHROID) 125 MCG tablet, Take 125 mcg by mouth every morning., Disp: , Rfl:    RYBELSUS 3 MG TABS, Take 1 tablet by mouth daily., Disp: , Rfl:    Tiotropium Bromide Monohydrate (SPIRIVA RESPIMAT) 2.5 MCG/ACT AERS, Inhale 2 puffs into the lungs daily., Disp: 8 g, Rfl: 0

## 2021-10-07 NOTE — Progress Notes (Signed)
PFT done today. 

## 2021-10-07 NOTE — Progress Notes (Signed)
Patient seen in the office today and instructed on use of Spiriva 2.5mcg.  Patient expressed understanding and demonstrated technique. ° °Antario Yasuda CMA 10/07/2021 °

## 2021-10-09 LAB — PULMONARY FUNCTION TEST
DL/VA % pred: 127 %
DL/VA: 5.4 ml/min/mmHg/L
DLCO cor % pred: 96 %
DLCO cor: 16.52 ml/min/mmHg
DLCO unc % pred: 96 %
DLCO unc: 16.52 ml/min/mmHg
FEF 25-75 Post: 0.69 L/sec
FEF 25-75 Pre: 0.56 L/sec
FEF2575-%Change-Post: 23 %
FEF2575-%Pred-Post: 44 %
FEF2575-%Pred-Pre: 35 %
FEV1-%Change-Post: 5 %
FEV1-%Pred-Post: 60 %
FEV1-%Pred-Pre: 57 %
FEV1-Post: 1.12 L
FEV1-Pre: 1.06 L
FEV1FVC-%Change-Post: 9 %
FEV1FVC-%Pred-Pre: 88 %
FEV6-%Change-Post: -3 %
FEV6-%Pred-Post: 65 %
FEV6-%Pred-Pre: 68 %
FEV6-Post: 1.54 L
FEV6-Pre: 1.6 L
FEV6FVC-%Change-Post: 0 %
FEV6FVC-%Pred-Post: 105 %
FEV6FVC-%Pred-Pre: 105 %
FVC-%Change-Post: -3 %
FVC-%Pred-Post: 62 %
FVC-%Pred-Pre: 64 %
FVC-Post: 1.54 L
FVC-Pre: 1.6 L
Post FEV1/FVC ratio: 73 %
Post FEV6/FVC ratio: 100 %
Pre FEV1/FVC ratio: 66 %
Pre FEV6/FVC Ratio: 100 %
RV % pred: 116 %
RV: 2.42 L
TLC % pred: 98 %
TLC: 4.45 L

## 2021-10-15 ENCOUNTER — Encounter: Payer: Self-pay | Admitting: Pulmonary Disease

## 2021-10-23 DIAGNOSIS — G4733 Obstructive sleep apnea (adult) (pediatric): Secondary | ICD-10-CM | POA: Diagnosis not present

## 2021-10-28 ENCOUNTER — Other Ambulatory Visit: Payer: Self-pay

## 2021-10-28 ENCOUNTER — Ambulatory Visit (HOSPITAL_BASED_OUTPATIENT_CLINIC_OR_DEPARTMENT_OTHER): Payer: PPO | Admitting: Cardiology

## 2021-10-28 VITALS — BP 148/72 | HR 88 | Ht 60.0 in | Wt 204.0 lb

## 2021-10-28 DIAGNOSIS — Z7189 Other specified counseling: Secondary | ICD-10-CM

## 2021-10-28 DIAGNOSIS — R0601 Orthopnea: Secondary | ICD-10-CM

## 2021-10-28 DIAGNOSIS — R072 Precordial pain: Secondary | ICD-10-CM | POA: Diagnosis not present

## 2021-10-28 DIAGNOSIS — R0609 Other forms of dyspnea: Secondary | ICD-10-CM

## 2021-10-28 DIAGNOSIS — Z01812 Encounter for preprocedural laboratory examination: Secondary | ICD-10-CM | POA: Diagnosis not present

## 2021-10-28 LAB — BASIC METABOLIC PANEL
BUN/Creatinine Ratio: 14 (ref 12–28)
BUN: 11 mg/dL (ref 8–27)
CO2: 29 mmol/L (ref 20–29)
Calcium: 10.3 mg/dL (ref 8.7–10.3)
Chloride: 98 mmol/L (ref 96–106)
Creatinine, Ser: 0.78 mg/dL (ref 0.57–1.00)
Glucose: 217 mg/dL — ABNORMAL HIGH (ref 70–99)
Potassium: 4.1 mmol/L (ref 3.5–5.2)
Sodium: 140 mmol/L (ref 134–144)
eGFR: 80 mL/min/{1.73_m2} (ref >59)

## 2021-10-28 MED ORDER — METOPROLOL TARTRATE 50 MG PO TABS: ORAL_TABLET | ORAL | 0 refills | Status: DC

## 2021-10-28 NOTE — Progress Notes (Incomplete)
Cardiology Office Note:    Date:  10/28/2021   ID:  Gabrielle Randall, DOB 03/04/1946, MRN [id number]  PCP:  Lewis Moccasin, MD  Cardiologist:  Jodelle Red, MD  Referring MD: Martina Sinner, MD   No chief complaint on file.   History of Present Illness:    Gabrielle Randall is a 76 y.o. female with a hx of hypertension, type 2 diabetes, obesity, OSA, COPD, and asthma, who is seen as a new consult at the request of Martina Sinner, MD for the evaluation and management of DOE and orthopnea.  Notes from 10/07/2021 visit with Dr. Francine Graven reviewed. She continued to experience DOE such as with carrying groceries, although improved on Symbicort. She also noticed intermittent wheezing at rest and orthopnea.  Cardiovascular risk factors: Prior clinical ASCVD: None. Comorbid conditions: Hypertension. Hyperlipidemia - Was on medication at one time, but her latest lab work was reportedly normal. Diabetes - 6 to 7 years. Metabolic syndrome/Obesity: Highest adult weight is 216 lbs. Currently 204 lbs. Chronic inflammatory conditions: COPD - She denies ever needing oxygen. Tobacco use history: Quit smoking in 2007. At peak 0.75 packs/day since 76 yo. Family history: Maternal grandmother had lung disease. Her mother had multiple stents. Prior cardiac testing and/or incidental findings on other testing (ie coronary calcium): Per Dr. Francine Graven "Home sleep study 09/16/21 showed moderate obstructive sleep apnea with AHI of 24.3/hr. She has been ordered for CPAP therapy with auto-titration to start." Exercise level: Her breathing has improved since starting Spiriva. She has stairs at home, needs to rest every few stairs. She also has issues with her knees. Typically she does not exercise, walks around the house. Normally she is more limited by shortness of breath. Sometimes she experiences chest pain or chest heaviness, which she states most of the time is attributable to her lungs. She is unable  to lie flat for long periods. She sleeps on 3 pillows, has done this for a while. Sometimes she feels hard palpitations in her upper chest/neck, which usually occurs after heavy exertion. Occasionally she develops LE edema if she sits for too long or rides in a car for long periods.   She denies any lightheadedness, headaches, syncope, or PND.  Past Medical History:  Diagnosis Date   Asthma    Hypertension    Type II diabetes mellitus (HCC)     No past surgical history on file.  Current Medications: Current Outpatient Medications on File Prior to Visit  Medication Sig   acetaminophen (TYLENOL) 650 MG CR tablet Take 650 mg by mouth every 8 (eight) hours as needed for pain.   albuterol (VENTOLIN HFA) 108 (90 Base) MCG/ACT inhaler Inhale 1-2 puffs into the lungs every 6 (six) hours as needed for wheezing or shortness of breath.   amLODipine (NORVASC) 5 MG tablet Take 5 mg by mouth daily.   budesonide-formoterol (SYMBICORT) 160-4.5 MCG/ACT inhaler Inhale 2 puffs into the lungs 2 (two) times daily. Taking 1x daily   levothyroxine (SYNTHROID) 125 MCG tablet Take 125 mcg by mouth every morning.   RYBELSUS 3 MG TABS Take 1 tablet by mouth daily.   Tiotropium Bromide Monohydrate (SPIRIVA RESPIMAT) 2.5 MCG/ACT AERS Inhale 2 puffs into the lungs daily.   Cholecalciferol (VITAMIN D3) 1.25 MG (50000 UT) CAPS Take 1 capsule by mouth once a week.   No current facility-administered medications on file prior to visit.     Allergies:   Codeine   Social History   Tobacco Use  Smoking status: Former    Packs/day: 1.00    Types: Cigarettes    Start date: 06/30/1962    Quit date: 08/25/2005    Years since quitting: 16.1   Smokeless tobacco: Never    Family History: family history is not on file.  ROS:   Please see the history of present illness.  Additional pertinent ROS: Constitutional: Negative for chills, fever, night sweats, unintentional weight loss  HENT: Negative for ear pain and  hearing loss.   Eyes: Negative for loss of vision and eye pain.  Respiratory: Negative for cough, sputum, wheezing. Positive for shortness of breath, orthopnea.  Cardiovascular: See HPI. Gastrointestinal: Negative for abdominal pain, melena, and hematochezia.  Genitourinary: Negative for dysuria and hematuria.  Musculoskeletal: Negative for falls and myalgias. Positive for bilateral knee pain. Skin: Negative for itching and rash.  Neurological: Negative for focal weakness, focal sensory changes and loss of consciousness.  Endo/Heme/Allergies: Does not bruise/bleed easily.     EKGs/Labs/Other Studies Reviewed:    The following studies were reviewed today:  Bilateral LE Venous Doppler 03/31/2006: Findings: Complete compressibility is demonstrated throughout the visualized deep veins. No venous filling defects are identified by gray-scale or color Doppler sonography. Doppler waveforms show normal direction of venous flow, with normal phasicity and response to augmentation. Slightly complex right popliteal cyst measures 4.8 cm long x 0.8 cm AP x 1.7 cm wide.  IMPRESSION:  1. Negative for deep venous thrombosis.  2. Slightly complex right popliteal cyst.  EKG:  EKG is personally reviewed.   10/28/2021: ***  Recent Labs: No results found for requested labs within last 8760 hours.   Recent Lipid Panel No results found for: CHOL, TRIG, HDL, CHOLHDL, VLDL, LDLCALC, LDLDIRECT  Physical Exam:    VS:  BP (!) 148/72 (BP Location: Right Arm, Patient Position: Sitting, Cuff Size: Large)    Pulse 88    Ht 5' (1.524 m)    Wt 204 lb (92.5 kg)    BMI 39.84 kg/m     Wt Readings from Last 3 Encounters:  10/28/21 204 lb (92.5 kg)  10/07/21 201 lb (91.2 kg)  08/21/21 201 lb 3.2 oz (91.3 kg)    GEN: Well nourished, well developed in no acute distress HEENT: Normal, moist mucous membranes NECK: No JVD CARDIAC: regular rhythm, normal S1 and S2, no rubs or gallops. ***1/6 soft systolic  murmur. VASCULAR: Radial and DP pulses 2+ bilaterally. No carotid bruits RESPIRATORY:  Clear to auscultation without rales, wheezing or rhonchi  ABDOMEN: Soft, non-tender, non-distended MUSCULOSKELETAL:  Ambulates independently SKIN: Warm and dry, no edema NEUROLOGIC:  Alert and oriented x 3. No focal neuro deficits noted. PSYCHIATRIC:  Normal affect    ASSESSMENT:    No diagnosis found. PLAN:     Cardiac risk counseling and prevention recommendations: -recommend heart healthy/Mediterranean diet, with whole grains, fruits, vegetable, fish, lean meats, nuts, and olive oil. Limit salt. -recommend moderate walking, 3-5 times/week for 30-50 minutes each session. Aim for at least 150 minutes.week. Goal should be pace of 3 miles/hours, or walking 1.5 miles in 30 minutes -recommend avoidance of tobacco products. Avoid excess alcohol. -ASCVD risk score: The ASCVD Risk score (Arnett DK, et al., 2019) failed to calculate for the following reasons:   Cannot find a previous HDL lab   Cannot find a previous total cholesterol lab    Plan for follow up: 6 weeks or sooner as needed.  Jodelle Red, MD, PhD, [facility] [place]   [facility]  Medication Adjustments/Labs and Tests Ordered: Current medicines are reviewed at length with the patient today.  Concerns regarding medicines are outlined above.   No orders of the defined types were placed in this encounter.  No orders of the defined types were placed in this encounter.  There are no Patient Instructions on file for this visit.   I,Mathew Stumpf,acting as a Neurosurgeon for [employer], MD.,have documented all relevant documentation on the behalf of Jodelle Red, MD,as directed by  Jodelle Red, MD while in the presence of Jodelle Red, MD.  ***  Signed, Jodelle Red, MD PhD 10/28/2021 2:24 PM    [facility]

## 2021-10-28 NOTE — Patient Instructions (Addendum)
Medication Instructions:  ?Your Physician recommend you continue on your current medication as directed.   ? ?*If you need a refill on your cardiac medications before your next appointment, please call your pharmacy* ? ? ?Lab Work: ?Your provider has recommended lab work today (BMP). Please have this collected at [facility] [place]. The lab is open 8:00 am - 4:30 pm. Please avoid 12:00p - 1:00p for lunch hour. You do not need an appointment. Please go to [address] [place], [place] [address]. This is in the Primary Care office on the 3rd floor, let them know you are there for blood work and they will direct you to the lab. ? ?If you have labs (blood work) drawn today and your tests are completely normal, you will receive your results only by: ?MyChart Message (if you have MyChart) OR ?A paper copy in the mail ?If you have any lab test that is abnormal or we need to change your treatment, we will call you to review the results. ? ? ?Testing/Procedures: ?Your physician has requested that you have an echocardiogram. Echocardiography is a painless test that uses sound waves to create images of your heart. It provides your doctor with information about the size and shape of your heart and how well your heart?s chambers and valves are working. This procedure takes approximately one hour. There are no restrictions for this procedure. ?[address] ? ?Cardiac CT Angiography (CTA), is a special type of CT scan that uses a computer to produce multi-dimensional views of major blood vessels throughout the body. In CT angiography, a contrast material is injected through an IV to help visualize the blood vessels  ?[facility]  ? ? ?Follow-Up: ?At [facility], you and your health needs are our priority.  As part of our continuing mission to provide you with exceptional heart care, we have created designated Provider Care Teams.  These Care Teams include your primary  Cardiologist (physician) and Advanced Practice Providers (APPs -  Physician Assistants and Nurse Practitioners) who all work together to provide you with the care you need, when you need it. ? ?We recommend signing up for the patient portal called "MyChart".  Sign up information is provided on this After Visit Summary.  MyChart is used to connect with patients for Virtual Visits (Telemedicine).  Patients are able to view lab/test results, encounter notes, upcoming appointments, etc.  Non-urgent messages can be sent to your provider as well.   ?To learn more about what you can do with MyChart, go to [email or web address].   ? ?Your next appointment:   ?6 week(s) ? ?The format for your next appointment:   ?In Person ? ?Provider:   ?Jodelle Red, MD  ? ? ? ?Your cardiac CT will be scheduled at one of the below locations:  ? ?[facility] ?[address] ?[place], [place] [address] ?[phone] ? ? ?If scheduled at [facility], please arrive at the [facility] and Children's Entrance (Entrance C2) of [facility] 30 minutes prior to test start time. ?You can use the FREE valet parking offered at entrance C (encouraged to control the heart rate for the test)  ?Proceed to the [facility] Radiology Department (first floor) to check-in and test prep. ? ?All radiology patients and guests should use entrance C2 at [facility], accessed from [facility], even though the hospital's physical address listed is [address]. ? ? ? ?If scheduled at  [facility], please arrive 15 mins early for check-in and test prep. ? ?Please follow these instructions carefully (unless otherwise directed): ? ? ?On the Night Before the Test: ?Be sure to Drink plenty of water. ?Do not consume any caffeinated/decaffeinated beverages or chocolate 12 hours prior to your test. ?Do not take any antihistamines 12 hours prior to your test. ? ? ?On the Day of the  Test: ?Drink plenty of water until 1 hour prior to the test. ?Do not eat any food 4 hours prior to the test. ?You may take your regular medications prior to the test.  ?Take metoprolol (Lopressor) 50 mg two hours prior to test. ?FEMALES- please wear underwire-free bra if available, avoid dresses & tight clothing ? ?     ?After the Test: ?Drink plenty of water. ?After receiving IV contrast, you may experience a mild flushed feeling. This is normal. ?On occasion, you may experience a mild rash up to 24 hours after the test. This is not dangerous. If this occurs, you can take Benadryl 25 mg and increase your fluid intake. ?If you experience trouble breathing, this can be serious. If it is severe call 911 IMMEDIATELY. If it is mild, please call our office. ?If you take any of these medications: Glipizide/Metformin, Avandament, Glucavance, please do not take 48 hours after completing test unless otherwise instructed. ? ?We will call to schedule your test 2-4 weeks out understanding that some insurance companies will need an authorization prior to the service being performed.  ? ?For non-scheduling related questions, please contact the cardiac imaging nurse navigator should you have any questions/concerns: ?[place], Cardiac Imaging Nurse Navigator ?Larey Brick, Cardiac Imaging Nurse Navigator ?[facility] ?Direct Office Dial: [phone]  ? ?For scheduling needs, including cancellations and rescheduling, please call [place], [phone]. ? ?

## 2021-10-29 ENCOUNTER — Ambulatory Visit (INDEPENDENT_AMBULATORY_CARE_PROVIDER_SITE_OTHER): Payer: PPO

## 2021-10-29 DIAGNOSIS — R0609 Other forms of dyspnea: Secondary | ICD-10-CM | POA: Diagnosis not present

## 2021-10-29 LAB — ECHOCARDIOGRAM COMPLETE
AR max vel: 1.94 cm2
AV Area VTI: 1.86 cm2
AV Area mean vel: 1.9 cm2
AV Mean grad: 5 mmHg
AV Peak grad: 9.9 mmHg
Ao pk vel: 1.57 m/s
Area-P 1/2: 3.46 cm2
Calc EF: 72.5 %
S' Lateral: 2.42 cm
Single Plane A2C EF: 74.1 %
Single Plane A4C EF: 70.7 %

## 2021-10-30 DIAGNOSIS — E1165 Type 2 diabetes mellitus with hyperglycemia: Secondary | ICD-10-CM | POA: Diagnosis not present

## 2021-10-30 DIAGNOSIS — I10 Essential (primary) hypertension: Secondary | ICD-10-CM | POA: Diagnosis not present

## 2021-11-05 ENCOUNTER — Encounter (HOSPITAL_BASED_OUTPATIENT_CLINIC_OR_DEPARTMENT_OTHER): Payer: Self-pay | Admitting: Cardiology

## 2021-11-06 ENCOUNTER — Telehealth (HOSPITAL_COMMUNITY): Payer: Self-pay | Admitting: *Deleted

## 2021-11-06 NOTE — Telephone Encounter (Signed)
Reaching out to patient to offer assistance regarding upcoming cardiac imaging study; pt verbalizes understanding of appt date/time, parking situation and where to check in, pre-test NPO status and medications ordered, and verified current allergies; name and call back number provided for further questions should they arise ? ?Gordy Clement RN Navigator Cardiac Imaging ?[facility] ?[phone] office ?[phone] cell ? ?Patient to take 50mg  metoprolol tartrate two hours prior to her cardiac CT scan.  She is aware to arrive at 11:30am for her noon scan. ?

## 2021-11-07 DIAGNOSIS — E1165 Type 2 diabetes mellitus with hyperglycemia: Secondary | ICD-10-CM | POA: Diagnosis not present

## 2021-11-08 ENCOUNTER — Ambulatory Visit (HOSPITAL_COMMUNITY)
Admission: RE | Admit: 2021-11-08 | Discharge: 2021-11-08 | Disposition: A | Discharge status: Home / Self Care | Payer: PPO | Source: Ambulatory Visit | Attending: Cardiology | Admitting: Cardiology

## 2021-11-08 ENCOUNTER — Other Ambulatory Visit: Payer: Self-pay

## 2021-11-08 DIAGNOSIS — R0609 Other forms of dyspnea: Secondary | ICD-10-CM | POA: Diagnosis not present

## 2021-11-08 DIAGNOSIS — I700 Atherosclerosis of aorta: Secondary | ICD-10-CM | POA: Insufficient documentation

## 2021-11-08 DIAGNOSIS — I2510 Atherosclerotic heart disease of native coronary artery without angina pectoris: Secondary | ICD-10-CM | POA: Insufficient documentation

## 2021-11-08 DIAGNOSIS — R931 Abnormal findings on diagnostic imaging of heart and coronary circulation: Secondary | ICD-10-CM | POA: Diagnosis not present

## 2021-11-08 MED ORDER — IOHEXOL 350 MG/ML SOLN
100.0000 mL | Freq: Once | INTRAVENOUS | Status: AC | PRN
  Administered 2021-11-08: 100 mL via INTRAVENOUS

## 2021-11-08 MED ORDER — NITROGLYCERIN 0.4 MG SL SUBL
0.8000 mg | SUBLINGUAL_TABLET | Freq: Once | SUBLINGUAL | Status: AC
  Administered 2021-11-08: 0.8 mg via SUBLINGUAL

## 2021-11-08 MED ORDER — NITROGLYCERIN 0.4 MG SL SUBL
SUBLINGUAL_TABLET | SUBLINGUAL | Status: AC
  Filled 2021-11-08: qty 2

## 2021-11-11 ENCOUNTER — Other Ambulatory Visit: Payer: Self-pay | Admitting: Cardiology

## 2021-11-11 DIAGNOSIS — I2510 Atherosclerotic heart disease of native coronary artery without angina pectoris: Secondary | ICD-10-CM | POA: Diagnosis not present

## 2021-11-11 DIAGNOSIS — R072 Precordial pain: Secondary | ICD-10-CM

## 2021-11-11 DIAGNOSIS — R931 Abnormal findings on diagnostic imaging of heart and coronary circulation: Secondary | ICD-10-CM

## 2021-11-12 ENCOUNTER — Ambulatory Visit (HOSPITAL_COMMUNITY)
Admission: RE | Admit: 2021-11-12 | Discharge: 2021-11-12 | Disposition: A | Discharge status: Home / Self Care | Payer: PPO | Source: Ambulatory Visit | Attending: Cardiology | Admitting: Cardiology

## 2021-11-12 DIAGNOSIS — R072 Precordial pain: Secondary | ICD-10-CM

## 2021-11-12 DIAGNOSIS — R931 Abnormal findings on diagnostic imaging of heart and coronary circulation: Secondary | ICD-10-CM

## 2021-11-18 ENCOUNTER — Ambulatory Visit (HOSPITAL_BASED_OUTPATIENT_CLINIC_OR_DEPARTMENT_OTHER): Payer: PPO | Admitting: Cardiology

## 2021-11-18 ENCOUNTER — Other Ambulatory Visit: Payer: Self-pay

## 2021-11-18 ENCOUNTER — Encounter (HOSPITAL_BASED_OUTPATIENT_CLINIC_OR_DEPARTMENT_OTHER): Payer: Self-pay | Admitting: Cardiology

## 2021-11-18 VITALS — BP 135/79 | HR 81 | Ht 60.0 in | Wt 208.6 lb

## 2021-11-18 DIAGNOSIS — E7800 Pure hypercholesterolemia, unspecified: Secondary | ICD-10-CM | POA: Diagnosis not present

## 2021-11-18 DIAGNOSIS — Z6841 Body Mass Index (BMI) 40.0 and over, adult: Secondary | ICD-10-CM | POA: Diagnosis not present

## 2021-11-18 DIAGNOSIS — E119 Type 2 diabetes mellitus without complications: Secondary | ICD-10-CM | POA: Diagnosis not present

## 2021-11-18 DIAGNOSIS — I2510 Atherosclerotic heart disease of native coronary artery without angina pectoris: Secondary | ICD-10-CM | POA: Diagnosis not present

## 2021-11-18 DIAGNOSIS — Z712 Person consulting for explanation of examination or test findings: Secondary | ICD-10-CM | POA: Diagnosis not present

## 2021-11-18 DIAGNOSIS — H2513 Age-related nuclear cataract, bilateral: Secondary | ICD-10-CM | POA: Diagnosis not present

## 2021-11-18 DIAGNOSIS — Z79899 Other long term (current) drug therapy: Secondary | ICD-10-CM | POA: Diagnosis not present

## 2021-11-18 DIAGNOSIS — Z7189 Other specified counseling: Secondary | ICD-10-CM

## 2021-11-18 DIAGNOSIS — R0609 Other forms of dyspnea: Secondary | ICD-10-CM | POA: Diagnosis not present

## 2021-11-18 MED ORDER — ASPIRIN EC 81 MG PO TBEC: 81.0000 mg | DELAYED_RELEASE_TABLET | Freq: Every day | ORAL | 3 refills | Status: AC

## 2021-11-18 MED ORDER — ROSUVASTATIN CALCIUM 20 MG PO TABS: 20.0000 mg | ORAL_TABLET | Freq: Every day | ORAL | 11 refills | Status: DC

## 2021-11-18 NOTE — Patient Instructions (Addendum)
Medication Instructions:  ?START: Aspirin 81 mg ?             Rosuvastatin 20 mg daily ? ?*If you need a refill on your cardiac medications before your next appointment, please call your pharmacy* ? ? ?Lab Work: ?Your provider has recommended lab work in May, 2023 (Lipid, LFT). Please have this collected at [facility] [place]. The lab is open 8:00 am - 4:30 pm. Please avoid 12:00p - 1:00p for lunch hour. You do not need an appointment. Please go to [address] [place], [place] [address]. This is in the Primary Care office on the 3rd floor, let them know you are there for blood work and they will direct you to the lab. ? ? ? ?Testing/Procedures: ?None ordered today ? ? ?Follow-Up: ?At [facility], you and your health needs are our priority.  As part of our continuing mission to provide you with exceptional heart care, we have created designated Provider Care Teams.  These Care Teams include your primary Cardiologist (physician) and Advanced Practice Providers (APPs -  Physician Assistants and Nurse Practitioners) who all work together to provide you with the care you need, when you need it. ? ?We recommend signing up for the patient portal called "MyChart".  Sign up information is provided on this After Visit Summary.  MyChart is used to connect with patients for Virtual Visits (Telemedicine).  Patients are able to view lab/test results, encounter notes, upcoming appointments, etc.  Non-urgent messages can be sent to your provider as well.   ?To learn more about what you can do with MyChart, go to [email or web address].   ? ?Your next appointment:   ?4 month(s) ? ?The format for your next appointment:   ?In Person ? ?Provider:   ?Jodelle Red, MD{ ? ? ?

## 2021-11-18 NOTE — Progress Notes (Signed)
?Cardiology Office Note:   ? ?Date:  11/18/2021  ? ?ID:  Gabrielle Randall, DOB [birth year], MRN [id number] ? ?PCP:  Fanny Bien, MD  ?Cardiologist:  Buford Dresser, MD ? ?Referring MD: Fanny Bien, MD  ? ?CC: Follow-up ? ?History of Present Illness:   ? ?Gabrielle Randall is a 76 y.o. female with a hx of CAD, hypertension, type 2 diabetes, obesity, OSA, COPD, and asthma, who is seen for follow-up. I initially met her 11/05/2021 as a new consult at the request of Fanny Bien, MD for the evaluation and management of DOE and orthopnea. ? ?Today: ?Since her last visit, she had a cardiac/coronary CT with calcium score 11/08/2021. This showed a coronary calcium score of 1078. This was 96th percentile for age and sex matched control. Also noted at least moderate CAD, CADRADS = 3, with main area of concern mid LAD. This lesion was positive for stenosis by FFR. We discussed these results in detail. ? ?Overall, she is feeling okay. She continues to suffer from shortness of breath and associated chest heaviness. It is sometimes difficult for her to determine if chest pain or pressure is related to her heart versus her shortness of breath. Of note, she was feeling better on spiriva until the recent seasonal change to Spring.  ? ?Maybe once in a while she notices swelling. No recent issues with LE edema. ? ?Most recently she was on ezetimibe. She does not believe this caused any side effects. Previously while on Lipitor she was unable to walk due to myalgias. ? ?She denies any palpitations. No lightheadedness, headaches, syncope, orthopnea, or PND. ? ? ?Past Medical History:  ?Diagnosis Date  ? Asthma   ? Hypertension   ? Type II diabetes mellitus ([place])   ? ? ?History reviewed. No pertinent surgical history. ? ?Current Medications: ?Current Outpatient Medications on File Prior to Visit  ?Medication Sig  ? acetaminophen (TYLENOL) 650 MG CR tablet Take 650 mg by mouth every 8 (eight) hours as needed for  pain.  ? albuterol (VENTOLIN HFA) 108 (90 Base) MCG/ACT inhaler Inhale 1-2 puffs into the lungs every 6 (six) hours as needed for wheezing or shortness of breath.  ? amLODipine (NORVASC) 5 MG tablet Take 5 mg by mouth daily.  ? budesonide-formoterol (SYMBICORT) 160-4.5 MCG/ACT inhaler Inhale 2 puffs into the lungs 2 (two) times daily. Taking 1x daily  ? Cholecalciferol (VITAMIN D3) 1.25 MG (50000 UT) CAPS Take 1 capsule by mouth once a week.  ? LANTUS SOLOSTAR 100 UNIT/ML Solostar Pen SMARTSIG:20 Unit(s) SUB-Q Daily  ? levothyroxine (SYNTHROID) 125 MCG tablet Take 125 mcg by mouth every morning.  ? metoprolol tartrate (LOPRESSOR) 50 MG tablet TAKE 1 TABLET 2 HR PRIOR TO CARDIAC PROCEDURE  ? pioglitazone (ACTOS) 30 MG tablet Take 30 mg by mouth daily.  ? Tiotropium Bromide Monohydrate (SPIRIVA RESPIMAT) 2.5 MCG/ACT AERS Inhale 2 puffs into the lungs daily.  ? ?No current facility-administered medications on file prior to visit.  ?  ? ?Allergies:   Atorvastatin and Codeine  ? ?Social History  ? ?Tobacco Use  ? Smoking status: Former  ?  Packs/day: 1.00  ?  Types: Cigarettes  ?  Start date: 06/30/1962  ?  Quit date: 08/25/2005  ?  Years since quitting: 16.2  ? Smokeless tobacco: Never  ? ? ?Family History: ?family history is not on file. ? ?ROS:   ?Please see the history of present illness. ?(+) Shortness of breath ?(+) Chest pressure, heaviness ?All  other systems are reviewed and negative.  ? ? ?EKGs/Labs/Other Studies Reviewed:   ? ?The following studies were reviewed today: ? ?Cardiac/Coronary CT 11/08/2021: ?FINDINGS: ?Coronary calcium score: The patient's coronary artery calcium score ?is 1078, which places the patient in the 96th percentile. ?  ?Coronary arteries: Normal coronary origins.  Right dominance. ?  ?Right Coronary Artery: Normal caliber vessel, gives rise to PDA. ?Scattered mixed calcified and noncalcified plaque, maximum stenosis ?25-49% in mid RCA. ?  ?Left Main Coronary Artery: Normal caliber vessel.  Distal calcified ?plaque prior to LAD/LCx bifurcation with 1-24% stenosis. ?  ?Left Anterior Descending Coronary Artery: Normal caliber vessel. ?Proximal LAD with scattered mixed calcified and noncalcified plaque ?with maximum 25-49% stenosis. There is scattered mixed calcified and ?noncalcified plaque in the mid LAD with stenosis of at least 50-69%, ?and higher degree stenosis cannot be excluded. Gives rise to large ?first diagonal branch with scattered mixed calcified and ?noncalcified plaque. ?  ?Left Circumflex Artery: Normal caliber vessel. Mixed calcified and ?noncalcified plaque in proximal LCx with maximal 25-49% stenosis. ?Gives rise to 1 OM branch. ?  ?Aorta: Normal size, 35 mm at the mid ascending aorta (level of the ?PA bifurcation) measured double oblique. Aortic atherosclerosis. No ?dissection seen in visualized portions of the aorta. ?  ?Aortic Valve: No calcifications. Trileaflet. ?  ?Other findings: ?  ?Normal pulmonary vein drainage into the left atrium. ?  ?Normal left atrial appendage without a thrombus. ?  ?Normal size of the pulmonary artery. ?  ?Normal appearance of the pericardium. ?  ?IMPRESSION: ?1. At least moderate CAD, CADRADS = 3. CT FFR will be performed and ?reported separately. Main area of concern is mid LAD (see saved ?images). ?  ?2. Coronary calcium score of 1078. This was 96th percentile for age ?and sex matched control. ?  ?3. Normal coronary origin with right dominance. ?  ?4.  Aortic atherosclerosis. ? ?Echo 10/29/2021: ?Sonographer Comments: Patient is morbidly obese. Image acquisition  ?challenging due to COPD and Image acquisition challenging due to patient body habitus.  ?IMPRESSIONS  ? ? 1. Left ventricular ejection fraction, by estimation, is 60 to 65%. The  ?left ventricle has normal function. The left ventricle has no regional  ?wall motion abnormalities. There is mild left ventricular hypertrophy.  ?Left ventricular diastolic parameters  ?are indeterminate.  ? 2. Right  ventricular systolic function is normal. The right ventricular  ?size is normal. Moderately increased right ventricular wall thickness.  ?Tricuspid regurgitation signal is inadequate for assessing PA pressure.  ? 3. The mitral valve is normal in structure. No evidence of mitral valve  ?regurgitation.  ? 4. The aortic valve is tricuspid. Aortic valve regurgitation is not  ?visualized. No aortic stenosis is present.  ? 5. The inferior vena cava is normal in size with greater than 50%  ?respiratory variability, suggesting right atrial pressure of 3 mmHg.  ? ?Bilateral LE Venous Doppler 03/31/2006: ?Findings: Complete compressibility is demonstrated throughout the visualized deep veins. No venous filling defects are identified by gray-scale or color Doppler sonography. Doppler waveforms show normal direction of venous flow, with normal phasicity and response to augmentation. Slightly complex right popliteal cyst measures 4.8 cm long x 0.8 cm AP x 1.7 cm wide.  ?IMPRESSION:  ?1. Negative for deep venous thrombosis.  ?2. Slightly complex right popliteal cyst. ? ?EKG:  EKG is personally reviewed.   ?11/18/2021: EKG was not ordered. ?10/28/2021: NSR at 88 bpm ? ?Recent Labs: ?10/28/2021: BUN 11; Creatinine, Ser 0.78; Potassium 4.1; Sodium  140  ? ?Recent Lipid Panel ?No results found for: CHOL, TRIG, HDL, CHOLHDL, VLDL, LDLCALC, LDLDIRECT ? ?Physical Exam:   ? ?VS:  BP 135/79 (BP Location: Right Arm, Patient Position: Sitting, Cuff Size: Large)   Pulse 81   Ht 5' (1.524 m)   Wt 208 lb 9.6 oz (94.6 kg)   SpO2 99%   BMI 40.74 kg/m?    ? ?Wt Readings from Last 3 Encounters:  ?11/18/21 208 lb 9.6 oz (94.6 kg)  ?10/28/21 204 lb (92.5 kg)  ?10/07/21 201 lb (91.2 kg)  ?  ?GEN: Well nourished, well developed in no acute distress ?HEENT: Normal, moist mucous membranes ?NECK: No JVD ?CARDIAC: regular rhythm, normal S1 and S2, no rubs or gallops. 1/6 soft systolic murmur. ?VASCULAR: Radial and DP pulses 2+ bilaterally. No carotid  bruits ?RESPIRATORY:  Clear to auscultation without rales, wheezing or rhonchi  ?ABDOMEN: Soft, non-tender, non-distended ?MUSCULOSKELETAL:  Ambulates independently ?SKIN: Warm and dry, no edema ?NEUROLOGIC:  Alert a

## 2021-11-19 ENCOUNTER — Telehealth: Payer: Self-pay | Admitting: Pulmonary Disease

## 2021-11-19 MED ORDER — SPIRIVA RESPIMAT 2.5 MCG/ACT IN AERS: 2.0000 | INHALATION_SPRAY | Freq: Every day | RESPIRATORY_TRACT | 11 refills | Status: DC

## 2021-11-19 NOTE — Telephone Encounter (Signed)
Spoke to patient. She feels that spiriva is effective.  ?Rx sent to preferred pharmacy. ?Patient is aware and voiced her understanding.  ?Nothing further needed.  ? ?

## 2021-11-22 ENCOUNTER — Telehealth: Payer: Self-pay | Admitting: Pulmonary Disease

## 2021-11-22 MED ORDER — SPIRIVA RESPIMAT 2.5 MCG/ACT IN AERS: 2.0000 | INHALATION_SPRAY | Freq: Every day | RESPIRATORY_TRACT | 11 refills | Status: DC

## 2021-11-22 NOTE — Telephone Encounter (Signed)
Rx for pt's spiriva has been sent to pharmacy for pt. Called and spoke with pt letting her know this had been done and she verbalized understanding. Nothing further needed. ?

## 2021-11-23 DIAGNOSIS — G4733 Obstructive sleep apnea (adult) (pediatric): Secondary | ICD-10-CM | POA: Diagnosis not present

## 2021-11-25 ENCOUNTER — Ambulatory Visit (HOSPITAL_BASED_OUTPATIENT_CLINIC_OR_DEPARTMENT_OTHER): Payer: PPO | Admitting: Cardiology

## 2021-12-12 ENCOUNTER — Ambulatory Visit (HOSPITAL_BASED_OUTPATIENT_CLINIC_OR_DEPARTMENT_OTHER): Payer: PPO | Admitting: Cardiology

## 2021-12-23 DIAGNOSIS — I10 Essential (primary) hypertension: Secondary | ICD-10-CM | POA: Diagnosis not present

## 2021-12-23 DIAGNOSIS — E1165 Type 2 diabetes mellitus with hyperglycemia: Secondary | ICD-10-CM | POA: Diagnosis not present

## 2021-12-23 DIAGNOSIS — E559 Vitamin D deficiency, unspecified: Secondary | ICD-10-CM | POA: Diagnosis not present

## 2021-12-23 DIAGNOSIS — G4733 Obstructive sleep apnea (adult) (pediatric): Secondary | ICD-10-CM | POA: Diagnosis not present

## 2022-01-03 DIAGNOSIS — I10 Essential (primary) hypertension: Secondary | ICD-10-CM | POA: Diagnosis not present

## 2022-01-03 DIAGNOSIS — E1165 Type 2 diabetes mellitus with hyperglycemia: Secondary | ICD-10-CM | POA: Diagnosis not present

## 2022-01-03 DIAGNOSIS — E559 Vitamin D deficiency, unspecified: Secondary | ICD-10-CM | POA: Diagnosis not present

## 2022-01-03 DIAGNOSIS — E1121 Type 2 diabetes mellitus with diabetic nephropathy: Secondary | ICD-10-CM | POA: Diagnosis not present

## 2022-01-14 ENCOUNTER — Encounter (HOSPITAL_BASED_OUTPATIENT_CLINIC_OR_DEPARTMENT_OTHER): Payer: Self-pay

## 2022-02-22 LAB — HEPATIC FUNCTION PANEL

## 2022-02-22 LAB — LIPID PANEL

## 2022-03-20 ENCOUNTER — Encounter (HOSPITAL_BASED_OUTPATIENT_CLINIC_OR_DEPARTMENT_OTHER): Payer: Self-pay | Admitting: Cardiology

## 2022-03-20 ENCOUNTER — Ambulatory Visit (HOSPITAL_BASED_OUTPATIENT_CLINIC_OR_DEPARTMENT_OTHER): Payer: PPO | Admitting: Cardiology

## 2022-03-20 VITALS — BP 132/72 | HR 81 | Ht 60.0 in | Wt 213.0 lb

## 2022-03-20 DIAGNOSIS — R0609 Other forms of dyspnea: Secondary | ICD-10-CM | POA: Diagnosis not present

## 2022-03-20 DIAGNOSIS — E7800 Pure hypercholesterolemia, unspecified: Secondary | ICD-10-CM

## 2022-03-20 DIAGNOSIS — I2510 Atherosclerotic heart disease of native coronary artery without angina pectoris: Secondary | ICD-10-CM | POA: Diagnosis not present

## 2022-03-20 DIAGNOSIS — Z7189 Other specified counseling: Secondary | ICD-10-CM | POA: Diagnosis not present

## 2022-03-20 DIAGNOSIS — Z6841 Body Mass Index (BMI) 40.0 and over, adult: Secondary | ICD-10-CM | POA: Diagnosis not present

## 2022-03-20 NOTE — Patient Instructions (Addendum)
Medication Instructions:  Your Physician recommend you continue on your current medication as directed.    *If you need a refill on your cardiac medications before your next appointment, please call your pharmacy*   Lab Work: Our fax number is [phone]. Please have them fax a copy of your labs after you have them drawn.    Testing/Procedures: None ordered today   Follow-Up: At [facility], you and your health needs are our priority.  As part of our continuing mission to provide you with exceptional heart care, we have created designated Provider Care Teams.  These Care Teams include your primary Cardiologist (physician) and Advanced Practice Providers (APPs -  Physician Assistants and Nurse Practitioners) who all work together to provide you with the care you need, when you need it.  We recommend signing up for the patient portal called "MyChart".  Sign up information is provided on this After Visit Summary.  MyChart is used to connect with patients for Virtual Visits (Telemedicine).  Patients are able to view lab/test results, encounter notes, upcoming appointments, etc.  Non-urgent messages can be sent to your provider as well.   To learn more about what you can do with MyChart, go to [email or web address].    Your next appointment:   6 month(s)  The format for your next appointment:   In Person  Provider:   Jodelle Red, MD{

## 2022-03-20 NOTE — Progress Notes (Signed)
Cardiology Office Note:    Date:  03/20/2022   ID:  Gabrielle Randall, Gabrielle Randall 1946/01/30, MRN [id number]  PCP:  Gabrielle Bien, MD  Cardiologist:  Gabrielle Dresser, MD  Referring MD: Gabrielle Bien, MD   CC: Follow-up  History of Present Illness:    Gabrielle Randall is a 76 y.o. female with a hx of CAD, hypertension, type 2 diabetes, obesity, OSA, COPD, and asthma, who is seen for follow-up. I initially met her 11/05/2021 as a new consult at the request of Gabrielle Bien, MD for the evaluation and management of DOE and orthopnea.  History: cardiac/coronary CT with calcium score 11/08/2021. This showed a coronary calcium score of 1078. This was 96th percentile for age and sex matched control. Also noted at least moderate CAD, CADRADS = 3, with main area of concern mid LAD. This lesion was positive for stenosis by FFR.   Today: Doing well. Mild intermittent LE edema at the end of the day. Tolerating rosuvastatin and aspirin. Had labs done 7/1 but these were cancelled after drawn. She is due for labs 8/2 with her PCP.  No chest pain. Breathing is a bit better than prior. No routine exercise. Does have to climb stairs in her house, no longer have to stop midway up the stairs. Can do grocery shopping.  Checks BP at home intermittently. Numbers usually better than they are today.  Denies chest pain, shortness of breath at rest or with normal exertion. No PND, orthopnea, LE edema or unexpected weight gain. No syncope or palpitations.    Past Medical History:  Diagnosis Date   Asthma    Hypertension    Type II diabetes mellitus ([place])     No past surgical history on file.  Current Medications: Current Outpatient Medications on File Prior to Visit  Medication Sig   acetaminophen (TYLENOL) 650 MG CR tablet Take 650 mg by mouth every 8 (eight) hours as needed for pain.   albuterol (VENTOLIN HFA) 108 (90 Base) MCG/ACT inhaler Inhale 1-2 puffs into the lungs every 6 (six)  hours as needed for wheezing or shortness of breath.   amLODipine (NORVASC) 5 MG tablet Take 5 mg by mouth daily.   aspirin EC 81 MG tablet Take 1 tablet (81 mg total) by mouth daily. Swallow whole.   budesonide-formoterol (SYMBICORT) 160-4.5 MCG/ACT inhaler Inhale 2 puffs into the lungs 2 (two) times daily. Taking 1x daily   Cholecalciferol (VITAMIN D3) 1.25 MG (50000 UT) CAPS Take 1 capsule by mouth once a week.   LANTUS SOLOSTAR 100 UNIT/ML Solostar Pen SMARTSIG:20 Unit(s) SUB-Q Daily   levothyroxine (SYNTHROID) 125 MCG tablet Take 125 mcg by mouth every morning.   metoprolol tartrate (LOPRESSOR) 50 MG tablet TAKE 1 TABLET 2 HR PRIOR TO CARDIAC PROCEDURE   pioglitazone (ACTOS) 30 MG tablet Take 30 mg by mouth daily.   rosuvastatin (CRESTOR) 20 MG tablet Take 1 tablet (20 mg total) by mouth daily.   Tiotropium Bromide Monohydrate (SPIRIVA RESPIMAT) 2.5 MCG/ACT AERS Inhale 2 puffs into the lungs daily.   No current facility-administered medications on file prior to visit.     Allergies:   Atorvastatin and Codeine   Social History   Tobacco Use   Smoking status: Former    Packs/day: 1.00    Types: Cigarettes    Start date: 06/30/1962    Quit date: 08/25/2005    Years since quitting: 16.5   Smokeless tobacco: Never    Family History: Maternal grandmother had lung  disease. Her mother had multiple stents.  ROS:   Please see the history of present illness. All other systems are reviewed and negative.    EKGs/Labs/Other Studies Reviewed:    The following studies were reviewed today:  Cardiac/Coronary CT 11/08/2021: FINDINGS: Coronary calcium score: The patient's coronary artery calcium score is 1078, which places the patient in the 96th percentile.   Coronary arteries: Normal coronary origins.  Right dominance.   Right Coronary Artery: Normal caliber vessel, gives rise to PDA. Scattered mixed calcified and noncalcified plaque, maximum stenosis 25-49% in mid RCA.   Left Main  Coronary Artery: Normal caliber vessel. Distal calcified plaque prior to LAD/LCx bifurcation with 1-24% stenosis.   Left Anterior Descending Coronary Artery: Normal caliber vessel. Proximal LAD with scattered mixed calcified and noncalcified plaque with maximum 25-49% stenosis. There is scattered mixed calcified and noncalcified plaque in the mid LAD with stenosis of at least 50-69%, and higher degree stenosis cannot be excluded. Gives rise to large first diagonal branch with scattered mixed calcified and noncalcified plaque.   Left Circumflex Artery: Normal caliber vessel. Mixed calcified and noncalcified plaque in proximal LCx with maximal 25-49% stenosis. Gives rise to 1 OM branch.   Aorta: Normal size, 35 mm at the mid ascending aorta (level of the PA bifurcation) measured double oblique. Aortic atherosclerosis. No dissection seen in visualized portions of the aorta.   Aortic Valve: No calcifications. Trileaflet.   Other findings:   Normal pulmonary vein drainage into the left atrium.   Normal left atrial appendage without a thrombus.   Normal size of the pulmonary artery.   Normal appearance of the pericardium.   IMPRESSION: 1. At least moderate CAD, CADRADS = 3. CT FFR will be performed and reported separately. Main area of concern is mid LAD (see saved images).   2. Coronary calcium score of 1078. This was 96th percentile for age and sex matched control.   3. Normal coronary origin with right dominance.   4.  Aortic atherosclerosis.  Echo 10/29/2021: Sonographer Comments: Patient is morbidly obese. Image acquisition  challenging due to COPD and Image acquisition challenging due to patient body habitus.  IMPRESSIONS    1. Left ventricular ejection fraction, by estimation, is 60 to 65%. The  left ventricle has normal function. The left ventricle has no regional  wall motion abnormalities. There is mild left ventricular hypertrophy.  Left ventricular diastolic  parameters  are indeterminate.   2. Right ventricular systolic function is normal. The right ventricular  size is normal. Moderately increased right ventricular wall thickness.  Tricuspid regurgitation signal is inadequate for assessing PA pressure.   3. The mitral valve is normal in structure. No evidence of mitral valve  regurgitation.   4. The aortic valve is tricuspid. Aortic valve regurgitation is not  visualized. No aortic stenosis is present.   5. The inferior vena cava is normal in size with greater than 50%  respiratory variability, suggesting right atrial pressure of 3 mmHg.   Bilateral LE Venous Doppler 03/31/2006: Findings: Complete compressibility is demonstrated throughout the visualized deep veins. No venous filling defects are identified by gray-scale or color Doppler sonography. Doppler waveforms show normal direction of venous flow, with normal phasicity and response to augmentation. Slightly complex right popliteal cyst measures 4.8 cm long x 0.8 cm AP x 1.7 cm wide.  IMPRESSION:  1. Negative for deep venous thrombosis.  2. Slightly complex right popliteal cyst.  EKG:  EKG is personally reviewed.   03/20/22 ECG not ordered  11/18/2021: EKG was not ordered. 10/28/2021: NSR at 88 bpm  Recent Labs: 10/28/2021: BUN 11; Creatinine, Ser 0.78; Potassium 4.1; Sodium 140 02/21/2022: ALT CANCELED   Recent Lipid Panel    Component Value Date/Time   CHOL CANCELED 02/21/2022 1339   TRIG CANCELED 02/21/2022 1339   HDL CANCELED 02/21/2022 1339    Physical Exam:    VS:  BP (!) 147/79 (BP Location: Left Arm, Patient Position: Sitting, Cuff Size: Large)   Pulse 81   Ht 5' (1.524 m)   Wt 213 lb (96.6 kg)   SpO2 96%   BMI 41.60 kg/m     Wt Readings from Last 3 Encounters:  03/20/22 213 lb (96.6 kg)  11/18/21 208 lb 9.6 oz (94.6 kg)  10/28/21 204 lb (92.5 kg)    GEN: Well nourished, well developed in no acute distress HEENT: Normal, moist mucous membranes NECK: No  JVD CARDIAC: regular rhythm, normal S1 and S2, no rubs or gallops. 1/6 systolic murmur. VASCULAR: Radial and DP pulses 2+ bilaterally. No carotid bruits RESPIRATORY:  Clear to auscultation without rales, wheezing or rhonchi  ABDOMEN: Soft, non-tender, non-distended MUSCULOSKELETAL:  Ambulates independently SKIN: Warm and dry, no edema NEUROLOGIC:  Alert and oriented x 3. No focal neuro deficits noted. PSYCHIATRIC:  Normal affect    ASSESSMENT:    1. Coronary artery disease involving native coronary artery of native heart without angina pectoris   2. Dyspnea on exertion   3. Pure hypercholesterolemia   4. Class 3 severe obesity due to excess calories with serious comorbidity and body mass index (BMI) of 40.0 to 44.9 in adult [facility])   5. Cardiac risk counseling      PLAN:    CAD, obstructive based on FFR of mLAD lesion Dyspnea on exertion Hypercholesterolemia Obesity, BMI 41 Tolerating aspirin 81 mg daily -she had myalgia on atorvastatin but has not been on another statin. Now tolerating rosuvastatin. Due for lipids, has labs pending 8/2. She will have these sent to our office -denies clear angina but difficulty to determine due to chronic shortness of breath -she is familiar with SL NG as her husband has it, instructed on use and when to call 911 -reviewed red flag warning signs that need immediate medical attention -we discussed medical management and cath if symptoms progress despite medication. Would use nitrate over beta blocker if needed due to chronic dyspnea -discussed PREP program, she will consider and let me know  Cardiac risk counseling and prevention recommendations: -recommend heart healthy/Mediterranean diet, with whole grains, fruits, vegetable, fish, lean meats, nuts, and olive oil. Limit salt. -recommend moderate walking, 3-5 times/week for 30-50 minutes each session. Aim for at least 150 minutes.week. Goal should be pace of 3 miles/hours, or walking 1.5 miles in 30  minutes -recommend avoidance of tobacco products. Avoid excess alcohol.  Plan for follow up: 6 months or sooner as needed.  Gabrielle Dresser, MD, PhD, [facility]    Medication Adjustments/Labs and Tests Ordered: Current medicines are reviewed at length with the patient today.  Concerns regarding medicines are outlined above.   No orders of the defined types were placed in this encounter.  No orders of the defined types were placed in this encounter.  Patient Instructions  Medication Instructions:  Your Physician recommend you continue on your current medication as directed.    *If you need a refill on your cardiac medications before your next appointment, please call your pharmacy*   Lab Work: Our fax number is [phone]. Please have them fax a copy of your labs after you have them drawn.    Testing/Procedures: None ordered today   Follow-Up: At [facility], you and your health needs are our priority.  As part of our continuing mission to provide you with exceptional heart care, we have created designated Provider Care Teams.  These Care Teams include your primary Cardiologist (physician) and Advanced Practice Providers (APPs -  Physician Assistants and Nurse Practitioners) who all work together to provide you with the care you need, when you need it.  We recommend signing up for the patient portal called "MyChart".  Sign up information is provided on this After Visit Summary.  MyChart is used to connect with patients for Virtual Visits (Telemedicine).  Patients are able to view lab/test results, encounter notes, upcoming appointments, etc.  Non-urgent messages can be sent to your provider as well.   To learn more about what you can do with MyChart, go to [email or web address].    Your next appointment:   6 month(s)  The format for your next appointment:   In Person  Provider:   Buford Dresser, MD{       Signed, Gabrielle Dresser, MD PhD 03/20/2022     [place]

## 2022-03-26 DIAGNOSIS — E559 Vitamin D deficiency, unspecified: Secondary | ICD-10-CM | POA: Diagnosis not present

## 2022-03-26 DIAGNOSIS — I10 Essential (primary) hypertension: Secondary | ICD-10-CM | POA: Diagnosis not present

## 2022-03-26 DIAGNOSIS — E039 Hypothyroidism, unspecified: Secondary | ICD-10-CM | POA: Diagnosis not present

## 2022-03-26 DIAGNOSIS — E1165 Type 2 diabetes mellitus with hyperglycemia: Secondary | ICD-10-CM | POA: Diagnosis not present

## 2022-03-31 DIAGNOSIS — J069 Acute upper respiratory infection, unspecified: Secondary | ICD-10-CM | POA: Diagnosis not present

## 2022-03-31 DIAGNOSIS — Z0000 Encounter for general adult medical examination without abnormal findings: Secondary | ICD-10-CM | POA: Diagnosis not present

## 2022-03-31 DIAGNOSIS — Z1339 Encounter for screening examination for other mental health and behavioral disorders: Secondary | ICD-10-CM | POA: Diagnosis not present

## 2022-03-31 DIAGNOSIS — J45909 Unspecified asthma, uncomplicated: Secondary | ICD-10-CM | POA: Diagnosis not present

## 2022-03-31 DIAGNOSIS — Z1331 Encounter for screening for depression: Secondary | ICD-10-CM | POA: Diagnosis not present

## 2022-04-08 DIAGNOSIS — E039 Hypothyroidism, unspecified: Secondary | ICD-10-CM | POA: Diagnosis not present

## 2022-04-08 DIAGNOSIS — I10 Essential (primary) hypertension: Secondary | ICD-10-CM | POA: Diagnosis not present

## 2022-04-08 DIAGNOSIS — Z23 Encounter for immunization: Secondary | ICD-10-CM | POA: Diagnosis not present

## 2022-04-08 DIAGNOSIS — E559 Vitamin D deficiency, unspecified: Secondary | ICD-10-CM | POA: Diagnosis not present

## 2022-04-08 DIAGNOSIS — E1165 Type 2 diabetes mellitus with hyperglycemia: Secondary | ICD-10-CM | POA: Diagnosis not present

## 2022-04-08 DIAGNOSIS — E1121 Type 2 diabetes mellitus with diabetic nephropathy: Secondary | ICD-10-CM | POA: Diagnosis not present

## 2022-04-22 ENCOUNTER — Ambulatory Visit: Payer: PPO | Admitting: Pulmonary Disease

## 2022-05-02 ENCOUNTER — Ambulatory Visit: Payer: PPO | Admitting: Pulmonary Disease

## 2022-05-02 DIAGNOSIS — U071 COVID-19: Secondary | ICD-10-CM | POA: Diagnosis not present

## 2022-06-02 DIAGNOSIS — E782 Mixed hyperlipidemia: Secondary | ICD-10-CM | POA: Diagnosis not present

## 2022-06-02 DIAGNOSIS — E039 Hypothyroidism, unspecified: Secondary | ICD-10-CM | POA: Diagnosis not present

## 2022-06-02 DIAGNOSIS — I10 Essential (primary) hypertension: Secondary | ICD-10-CM | POA: Diagnosis not present

## 2022-06-04 DIAGNOSIS — E1165 Type 2 diabetes mellitus with hyperglycemia: Secondary | ICD-10-CM | POA: Diagnosis not present

## 2022-06-04 DIAGNOSIS — I10 Essential (primary) hypertension: Secondary | ICD-10-CM | POA: Diagnosis not present

## 2022-06-04 DIAGNOSIS — E782 Mixed hyperlipidemia: Secondary | ICD-10-CM | POA: Diagnosis not present

## 2022-06-04 DIAGNOSIS — J449 Chronic obstructive pulmonary disease, unspecified: Secondary | ICD-10-CM | POA: Diagnosis not present

## 2022-06-04 DIAGNOSIS — R739 Hyperglycemia, unspecified: Secondary | ICD-10-CM | POA: Diagnosis not present

## 2022-06-04 DIAGNOSIS — E039 Hypothyroidism, unspecified: Secondary | ICD-10-CM | POA: Diagnosis not present

## 2022-07-04 IMAGING — CT CT HEART MORP W/ CTA COR W/ SCORE W/ CA W/CM &/OR W/O CM
4 of 7 series · 8 of 20 positions shown, 9 images · IV contrast (APPLIED)
Comparison: Chest CT 12/19/2004.
COMPARISON: Chest CT 12/19/2004.

Addendum:
EXAM:
OVER-READ INTERPRETATION  CT CHEST

The following report is an over-read performed by radiologist Dr.
Mofidul Tiger [REDACTED] on 11/10/2021. This
over-read does not include interpretation of cardiac or coronary
anatomy or pathology. The coronary calcium score/coronary CTA
interpretation by the cardiologist is attached.
HISTORY: Chest pain/anginal equiv, high CAD risk, not treadmill candidate
Dyspnea on exertion (SINOBAD) SINOBAD
Cardiac/Coronary CT
TECHNIQUE: The patient was scanned on a Siemens Force scanner.
PROTOCOL: A 100 kV prospective scan was triggered in the descending thoracic
aorta at 111 HU's. Axial non-contrast 3 mm slices were carried out
through the heart. The data set was analyzed on a dedicated work
station and scored using the Agatston method. Gantry rotation speed
was 250 msecs and collimation was .6 mm. Heart rate was optimized
medically and sl NTG was given. The 3D data set was reconstructed in
5% intervals of the 35-75 % of the R-R cycle. Systolic and diastolic
phases were analyzed on a dedicated work station using MPR, MIP and
VRT modes. The patient received 100mL OMNIPAQUE IOHEXOL 350 MG/ML
SOLN of contrast.

[Series 6: best diast 74 % · axial · 0.44mm/px · z∈[-112,-63]mm · 2 of 368 slices shown, 3 images]
[im 123/368  vessel]
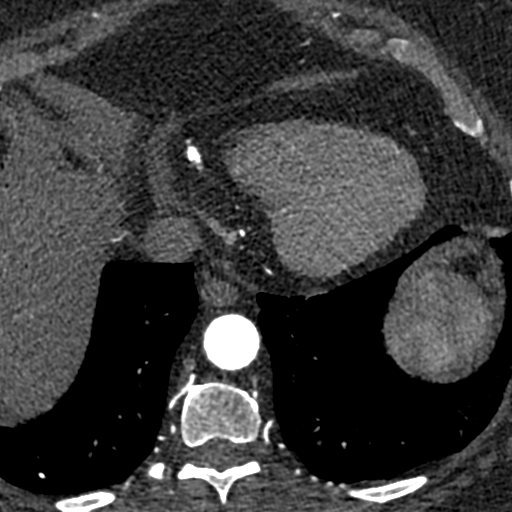
[im 123/368  lung]
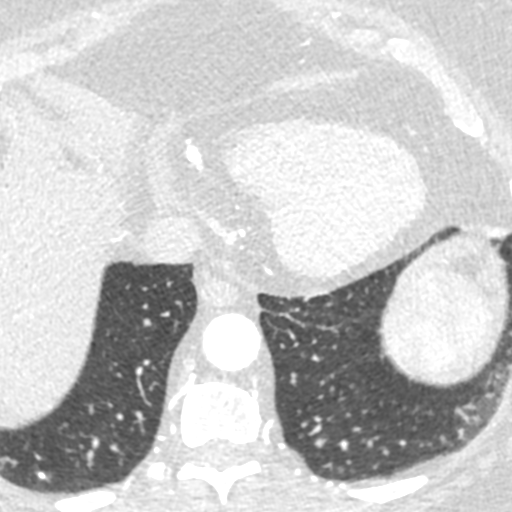
[im 245/368  vessel]
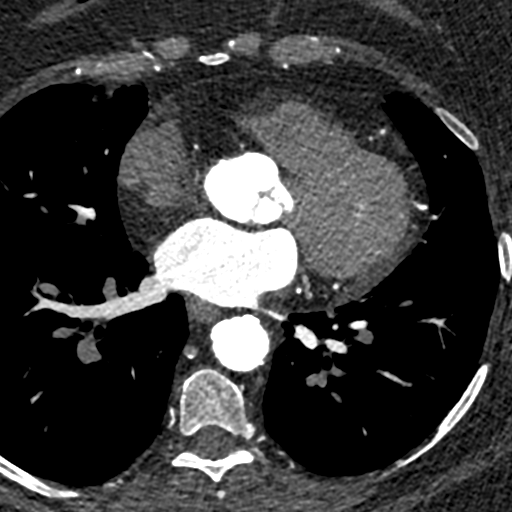

[Series 9: ts diast sharp 74 % · axial · 0.44mm/px · z∈[-112,-63]mm · 2 of 368 slices shown]
[im 123/368  lung]
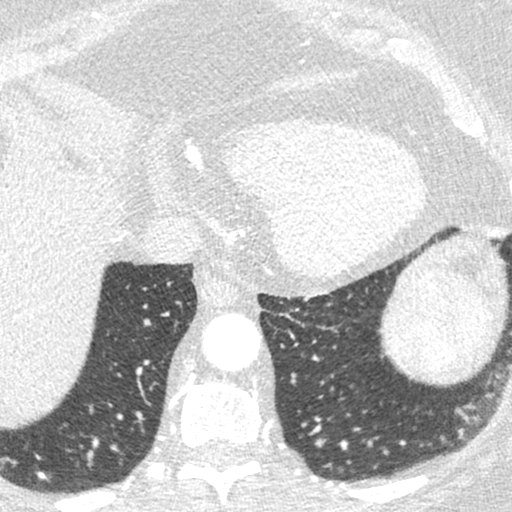
[im 245/368  lung]
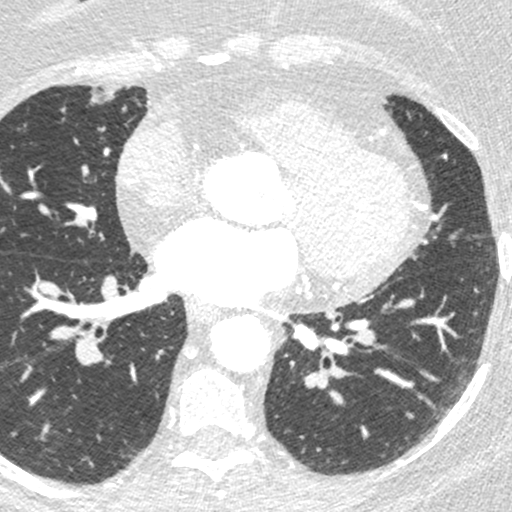

[Series 10: best syst 44 % · axial · 0.44mm/px · z∈[-112,-63]mm · 2 of 368 slices shown]
[im 123/368  vessel]
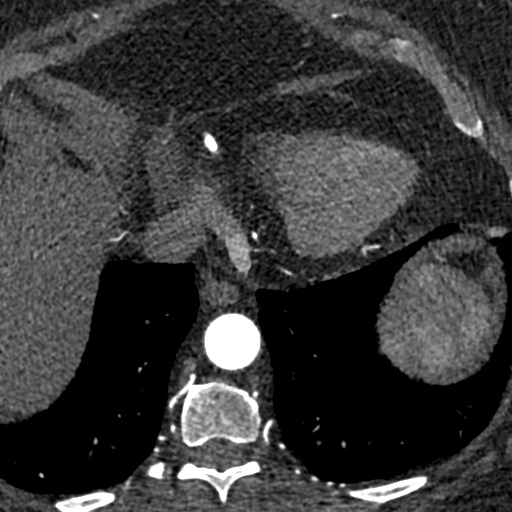
[im 245/368  vessel]
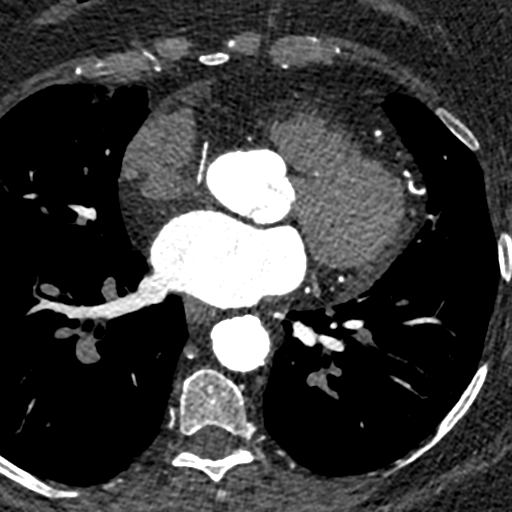

[Series 12: ts syst sharp 44 % · axial · 0.44mm/px · z∈[-112,-63]mm · 2 of 368 slices shown]
[im 123/368  lung]
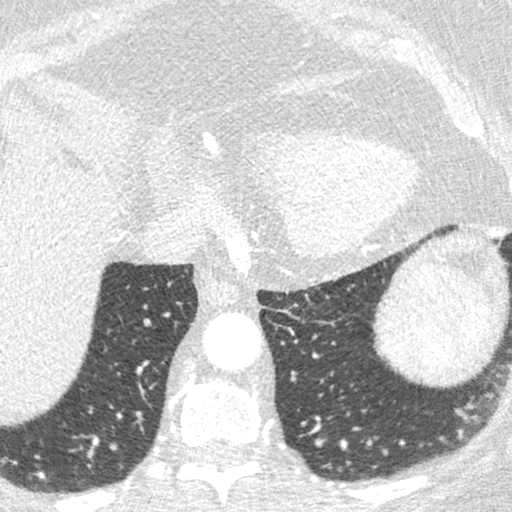
[im 245/368  lung]
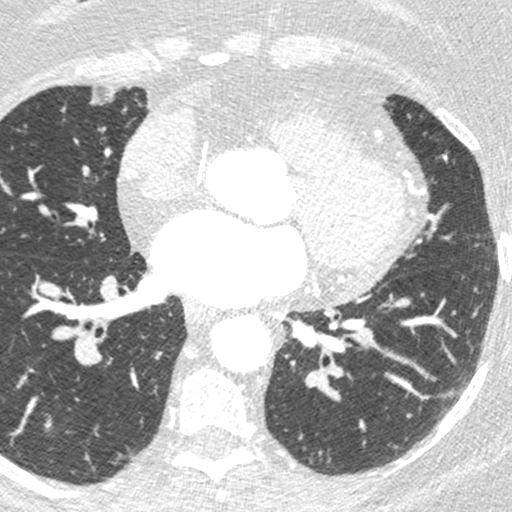

[8 of 20 positions shown; findings below may reference images not displayed]

FINDINGS: Aortic atherosclerosis. Within the visualized portions of the thorax
there are no suspicious appearing pulmonary nodules or masses, there
is no acute consolidative airspace disease, no pleural effusions, no
pneumothorax and no lymphadenopathy. Visualized portions of the
upper abdomen are unremarkable. There are no aggressive appearing
lytic or blastic lesions noted in the visualized portions of the
skeleton.
IMPRESSION: 1.  Aortic Atherosclerosis (WMTNY-R10.0).
FINDINGS: Coronary calcium score: The patient's coronary artery calcium score
is 6705, which places the patient in the 96th percentile.

Coronary arteries: Normal coronary origins.  Right dominance.

Right Coronary Artery: Normal caliber vessel, gives rise to PDA.
Scattered mixed calcified and noncalcified plaque, maximum stenosis
25-49% in mid RCA.

Left Main Coronary Artery: Normal caliber vessel. Distal calcified
plaque prior to LAD/LCx bifurcation with 1-24% stenosis.

Left Anterior Descending Coronary Artery: Normal caliber vessel.
Proximal LAD with scattered mixed calcified and noncalcified plaque
with maximum 25-49% stenosis. There is scattered mixed calcified and
noncalcified plaque in the mid LAD with stenosis of at least 50-69%,
and higher degree stenosis cannot be excluded. Gives rise to large
first diagonal branch with scattered mixed calcified and
noncalcified plaque.

Left Circumflex Artery: Normal caliber vessel. Mixed calcified and
noncalcified plaque in proximal LCx with maximal 25-49% stenosis.
Gives rise to 1 OM branch.

Aorta: Normal size, 35 mm at the mid ascending aorta (level of the
PA bifurcation) measured double oblique. Aortic atherosclerosis. No
dissection seen in visualized portions of the aorta.

Aortic Valve: No calcifications. Trileaflet.

Other findings:

Normal pulmonary vein drainage into the left atrium.

Normal left atrial appendage without a thrombus.

Normal size of the pulmonary artery.

Normal appearance of the pericardium.
IMPRESSION: 1. At least moderate CAD, CADRADS = 3. CT FFR will be performed and
reported separately. Main area of concern is mid LAD (see saved
images).

2. Coronary calcium score of 6705. This was 96th percentile for age
and sex matched control.

3. Normal coronary origin with right dominance.

4.  Aortic atherosclerosis.

INTERPRETATION:

1. CAD-RADS 0: No evidence of CAD (0%). Consider non-atherosclerotic
causes of chest pain.

2. CAD-RADS 1: Minimal non-obstructive CAD (0-24%). Consider
non-atherosclerotic causes of chest pain. Consider preventive
therapy and risk factor modification.

3. CAD-RADS 2: Mild non-obstructive CAD (25-49%). Consider
non-atherosclerotic causes of chest pain. Consider preventive
therapy and risk factor modification.

4. CAD-RADS 3: Moderate stenosis (50-69%). Consider symptom-guided
anti-ischemic pharmacotherapy as well as risk factor modification
per guideline directed care. Additional analysis with CT FFR will be
submitted.

5. CAD-RADS 4: Severe stenosis. (70-99% or > 50% left main). Cardiac
catheterization or CT FFR is recommended. Consider symptom-guided
anti-ischemic pharmacotherapy as well as risk factor modification
per guideline directed care. Invasive coronary angiography
recommended with revascularization per published guideline
statements.

6. CAD-RADS 5: Total coronary occlusion (100%). Consider cardiac
catheterization or viability assessment. Consider symptom-guided
anti-ischemic pharmacotherapy as well as risk factor modification
per guideline directed care.

7. CAD-RADS N: Non-diagnostic study. Obstructive CAD can't be
excluded. Alternative evaluation is recommended.

*** End of Addendum ***
EXAM:
OVER-READ INTERPRETATION  CT CHEST

The following report is an over-read performed by radiologist Dr.
Mofidul Tiger [REDACTED] on 11/10/2021. This
over-read does not include interpretation of cardiac or coronary
anatomy or pathology. The coronary calcium score/coronary CTA
interpretation by the cardiologist is attached.
FINDINGS: Aortic atherosclerosis. Within the visualized portions of the thorax
there are no suspicious appearing pulmonary nodules or masses, there
is no acute consolidative airspace disease, no pleural effusions, no
pneumothorax and no lymphadenopathy. Visualized portions of the
upper abdomen are unremarkable. There are no aggressive appearing
lytic or blastic lesions noted in the visualized portions of the
skeleton.
IMPRESSION: 1.  Aortic Atherosclerosis (WMTNY-R10.0).

## 2022-09-04 DIAGNOSIS — E1165 Type 2 diabetes mellitus with hyperglycemia: Secondary | ICD-10-CM | POA: Diagnosis not present

## 2022-09-04 DIAGNOSIS — E039 Hypothyroidism, unspecified: Secondary | ICD-10-CM | POA: Diagnosis not present

## 2022-09-04 DIAGNOSIS — E782 Mixed hyperlipidemia: Secondary | ICD-10-CM | POA: Diagnosis not present

## 2022-09-10 DIAGNOSIS — E039 Hypothyroidism, unspecified: Secondary | ICD-10-CM | POA: Diagnosis not present

## 2022-09-10 DIAGNOSIS — E782 Mixed hyperlipidemia: Secondary | ICD-10-CM | POA: Diagnosis not present

## 2022-09-10 DIAGNOSIS — E1165 Type 2 diabetes mellitus with hyperglycemia: Secondary | ICD-10-CM | POA: Diagnosis not present

## 2022-09-10 DIAGNOSIS — I10 Essential (primary) hypertension: Secondary | ICD-10-CM | POA: Diagnosis not present

## 2022-10-08 DIAGNOSIS — G4733 Obstructive sleep apnea (adult) (pediatric): Secondary | ICD-10-CM | POA: Diagnosis not present

## 2022-10-08 DIAGNOSIS — E1165 Type 2 diabetes mellitus with hyperglycemia: Secondary | ICD-10-CM | POA: Diagnosis not present

## 2022-10-08 DIAGNOSIS — E1122 Type 2 diabetes mellitus with diabetic chronic kidney disease: Secondary | ICD-10-CM | POA: Diagnosis not present

## 2022-10-08 DIAGNOSIS — I129 Hypertensive chronic kidney disease with stage 1 through stage 4 chronic kidney disease, or unspecified chronic kidney disease: Secondary | ICD-10-CM | POA: Diagnosis not present

## 2022-10-08 DIAGNOSIS — E1121 Type 2 diabetes mellitus with diabetic nephropathy: Secondary | ICD-10-CM | POA: Diagnosis not present

## 2022-10-08 DIAGNOSIS — J4540 Moderate persistent asthma, uncomplicated: Secondary | ICD-10-CM | POA: Diagnosis not present

## 2022-10-22 DIAGNOSIS — E1121 Type 2 diabetes mellitus with diabetic nephropathy: Secondary | ICD-10-CM | POA: Diagnosis not present

## 2022-10-22 DIAGNOSIS — E1165 Type 2 diabetes mellitus with hyperglycemia: Secondary | ICD-10-CM | POA: Diagnosis not present

## 2022-10-22 DIAGNOSIS — J4540 Moderate persistent asthma, uncomplicated: Secondary | ICD-10-CM | POA: Diagnosis not present

## 2022-10-22 DIAGNOSIS — I10 Essential (primary) hypertension: Secondary | ICD-10-CM | POA: Diagnosis not present

## 2022-11-10 DIAGNOSIS — E559 Vitamin D deficiency, unspecified: Secondary | ICD-10-CM | POA: Diagnosis not present

## 2022-11-10 DIAGNOSIS — E782 Mixed hyperlipidemia: Secondary | ICD-10-CM | POA: Diagnosis not present

## 2022-11-10 DIAGNOSIS — I10 Essential (primary) hypertension: Secondary | ICD-10-CM | POA: Diagnosis not present

## 2022-11-10 DIAGNOSIS — E039 Hypothyroidism, unspecified: Secondary | ICD-10-CM | POA: Diagnosis not present

## 2022-11-17 DIAGNOSIS — E0590 Thyrotoxicosis, unspecified without thyrotoxic crisis or storm: Secondary | ICD-10-CM | POA: Diagnosis not present

## 2022-11-17 DIAGNOSIS — E039 Hypothyroidism, unspecified: Secondary | ICD-10-CM | POA: Diagnosis not present

## 2022-11-17 DIAGNOSIS — E782 Mixed hyperlipidemia: Secondary | ICD-10-CM | POA: Diagnosis not present

## 2022-11-17 DIAGNOSIS — I10 Essential (primary) hypertension: Secondary | ICD-10-CM | POA: Diagnosis not present

## 2022-11-20 ENCOUNTER — Ambulatory Visit
Admission: RE | Admit: 2022-11-20 | Discharge: 2022-11-20 | Disposition: A | Discharge status: Home / Self Care | Payer: PPO | Source: Ambulatory Visit | Attending: Family Medicine | Admitting: Family Medicine

## 2022-11-20 ENCOUNTER — Other Ambulatory Visit: Payer: Self-pay | Admitting: Family Medicine

## 2022-11-20 DIAGNOSIS — I10 Essential (primary) hypertension: Secondary | ICD-10-CM | POA: Diagnosis not present

## 2022-11-20 DIAGNOSIS — M1712 Unilateral primary osteoarthritis, left knee: Secondary | ICD-10-CM

## 2022-11-20 DIAGNOSIS — M1711 Unilateral primary osteoarthritis, right knee: Secondary | ICD-10-CM

## 2022-11-20 DIAGNOSIS — M170 Bilateral primary osteoarthritis of knee: Secondary | ICD-10-CM | POA: Diagnosis not present

## 2022-12-01 DIAGNOSIS — E1165 Type 2 diabetes mellitus with hyperglycemia: Secondary | ICD-10-CM | POA: Diagnosis not present

## 2022-12-01 DIAGNOSIS — H353131 Nonexudative age-related macular degeneration, bilateral, early dry stage: Secondary | ICD-10-CM | POA: Diagnosis not present

## 2022-12-01 DIAGNOSIS — H2513 Age-related nuclear cataract, bilateral: Secondary | ICD-10-CM | POA: Diagnosis not present

## 2022-12-01 DIAGNOSIS — H524 Presbyopia: Secondary | ICD-10-CM | POA: Diagnosis not present

## 2022-12-01 DIAGNOSIS — E119 Type 2 diabetes mellitus without complications: Secondary | ICD-10-CM | POA: Diagnosis not present

## 2022-12-08 DIAGNOSIS — E1121 Type 2 diabetes mellitus with diabetic nephropathy: Secondary | ICD-10-CM | POA: Diagnosis not present

## 2022-12-08 DIAGNOSIS — E1143 Type 2 diabetes mellitus with diabetic autonomic (poly)neuropathy: Secondary | ICD-10-CM | POA: Diagnosis not present

## 2022-12-08 DIAGNOSIS — E1165 Type 2 diabetes mellitus with hyperglycemia: Secondary | ICD-10-CM | POA: Diagnosis not present

## 2022-12-08 DIAGNOSIS — I10 Essential (primary) hypertension: Secondary | ICD-10-CM | POA: Diagnosis not present

## 2022-12-08 DIAGNOSIS — Z23 Encounter for immunization: Secondary | ICD-10-CM | POA: Diagnosis not present

## 2022-12-08 DIAGNOSIS — I129 Hypertensive chronic kidney disease with stage 1 through stage 4 chronic kidney disease, or unspecified chronic kidney disease: Secondary | ICD-10-CM | POA: Diagnosis not present

## 2022-12-08 DIAGNOSIS — E1122 Type 2 diabetes mellitus with diabetic chronic kidney disease: Secondary | ICD-10-CM | POA: Diagnosis not present

## 2022-12-15 ENCOUNTER — Telehealth: Payer: Self-pay

## 2022-12-15 NOTE — Progress Notes (Signed)
[facility])  [facility] Quality Pharmacy Team   12/15/2022  ROSSELYN Randall 1945-12-29 [id number]  Reason for referral: Medication assistance with [facility]  Referral source: Dr. Duanne Guess Current insurance: [employer]  Outreach:  Successful telephone call with patient.  HIPAA identifiers verified. Patient was screened for available medication assistance programs for brand only medications which she is currently taking, including Spiriva, Lantus, and Mounjaro. The patient provided her income and household size information to determine which patient assistance programs (PAPs) she qualifies for.   Medication Review Findings:  Patient exceeds income limit for Spiriva PAP Lantus currently on backorder for [employer] PAP No PAP available for [facility]; pt taken Ozempic previously, qualifies for Ozempic PAP  Medication Assistance Findings:  Medication assistance needs identified: pt qualifes for Ozempic PAP, will reach out to PCP for approval to switch back to Ozempic from [facility]  Extra Help:  Not eligible for Extra Help Low Income Subsidy based on reported income and assets   Plan: Will contact PCP regarding Ozempic substitution approval so that the patient can obtain the medication through a PAP .  Thank you for allowing [facility] pharmacy to be a part of this patient's care.   Harlon Flor, PharmD Clinical Pharmacist  [facility] [phone]

## 2022-12-24 ENCOUNTER — Other Ambulatory Visit: Payer: Self-pay | Admitting: Pulmonary Disease

## 2022-12-25 ENCOUNTER — Telehealth: Payer: Self-pay | Admitting: Pulmonary Disease

## 2022-12-25 NOTE — Telephone Encounter (Signed)
Called and spoke with pt letting her know that it had been over a year since last appt and that we needed her to be seen for an OV prior to refilling her inhaler and she verbalized understanding. Appt scheduled for pt with JD tomorrow 5/3. Nothing further needed.

## 2022-12-25 NOTE — Telephone Encounter (Signed)
Patient is calling again to get a refill for her Spiriva.  She stated that she has almost run out of medicine and needs to get a new script.  Please advise.  CB# [phone]

## 2022-12-26 ENCOUNTER — Ambulatory Visit: Payer: PPO | Admitting: Pulmonary Disease

## 2022-12-26 ENCOUNTER — Encounter: Payer: Self-pay | Admitting: Pulmonary Disease

## 2022-12-26 VITALS — BP 128/84 | HR 88 | Ht 60.0 in | Wt 228.0 lb

## 2022-12-26 DIAGNOSIS — G4733 Obstructive sleep apnea (adult) (pediatric): Secondary | ICD-10-CM | POA: Diagnosis not present

## 2022-12-26 DIAGNOSIS — J4489 Other specified chronic obstructive pulmonary disease: Secondary | ICD-10-CM

## 2022-12-26 MED ORDER — SPIRIVA RESPIMAT 2.5 MCG/ACT IN AERS: 2.0000 | INHALATION_SPRAY | Freq: Every day | RESPIRATORY_TRACT | 11 refills | Status: DC

## 2022-12-26 MED ORDER — SPIRIVA RESPIMAT 2.5 MCG/ACT IN AERS: 2.0000 | INHALATION_SPRAY | Freq: Every day | RESPIRATORY_TRACT | 0 refills | Status: DC

## 2022-12-26 NOTE — Patient Instructions (Signed)
Continue advair 250-84mcg 1 puff twice daily  Continue spiriva 2 puffs daily  Use albuterol inhaler 1-2 puffs every 4-6 hours as needed.  Follow up in 1 year, call sooner if needed

## 2022-12-26 NOTE — Addendum Note (Signed)
Addended by: Maurene Capes on: 12/26/2022 11:17 AM   Modules accepted: Orders

## 2022-12-26 NOTE — Progress Notes (Signed)
Synopsis: Referred in December 2022 for shortness of breath by Maryelizabeth Rowan, MD  Subjective:   PATIENT ID: Gabrielle Randall GENDER: female DOB: 1946-06-14, MRN: [id number]   HPI  Chief Complaint  Patient presents with   Follow-up    F/U for asthma-copd.    Gabrielle Randall is a 77 year old woman, former smoker with history of asthma, obesity, DMII and hypertension who returns to pulmonary clinic for COPD.   She was started on spiriva 2.62mcg 2 puffs daily at last visit along with symbicort 160-4.81mcg 2 puffs twice daily. Her insurance stopped covering symbicort and she changed to advair diskus 250-61mcg 1 puff twice daily. No progression in her dyspnea. Has issues with climbing stairs or walking up hill. Heavy lifting.    Weight last visit 201lbs, today 228lbs.She could not tolerate CPAP therapy despite multiple attempts with various masks.  OV 10/07/21 Her shortness of breath has improved with using symbicort scheduled 2 puffs twice daily. She continues to have exertional dyspnea with exertion such has carrying grocery bags. She is able to walk up her steps now without stopping but has significant dyspnea. She notices intermittent wheezing at rest. She has orthopnea.   Pulmonary function test shows non-specific pattern with reduced FEV1 and FVC with normalized ratio after bronchodilation and normal TLC. Her DLCO is normal.   Home sleep study 09/16/21 showed moderate obstructive sleep apnea with AHI of 24.3/hr. She has been ordered for CPAP therapy with auto-titration to start.  OV 08/21/21 Patient reports progressive shortness of breath over the past year.  She now experiences dyspnea when climbing the stairs in her home to her bedroom where she needs to rest for 4 to 5 minutes.  She also notices dyspnea walking to the end of her driveway which is about 3 car length long.  She also notices dyspnea when carrying her groceries.  She does report wheezing with the shortness of breath.   She does report nighttime awakenings due to dyspnea.  She sleeps upright and an adjustable base bed as she is not able to lay flat for long periods of time as it is harder for her to breathe.  She does report history of snoring.  She has never had a sleep study in the past.  She has an as needed albuterol inhaler which does relieve her symptoms.  She is currently using Symbicort 160-4.5 MCG 2 puffs in the morning time.  She has a harder time breathing in the wintertime due to the cold weather as well as on hot humid summer days.  Strong perfumes and colognes along with certain cleaning agents bother her breathing.  She quit smoking in 2007 and was smoking about 0.75 packs/day since the age of 51.  She reports history of multiple pneumonias.  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  Her maternal grandmother had lung disease.  She currently lives with her husband and 2 grandsons.  Past Medical History:  Diagnosis Date   Asthma    Hypertension    Type II diabetes mellitus (HCC)      No family history on file.   Social History   Socioeconomic History   Marital status: Married    Spouse name: Not on file   Number of children: Not on file   Years of education: Not on file   Highest education level: Not on file  Occupational History   Not on file  Tobacco Use  Smoking status: Former    Packs/day: 1    Types: Cigarettes    Start date: 06/30/1962    Quit date: 08/25/2005    Years since quitting: 17.3   Smokeless tobacco: Never  Substance and Sexual Activity   Alcohol use: Not on file   Drug use: Not on file   Sexual activity: Not on file  Other Topics Concern   Not on file  Social History Narrative   Not on file   Social Determinants of Health   Financial Resource Strain: Not on file  Food Insecurity: Not on file  Transportation Needs: Not on file  Physical Activity: Not on file  Stress: Not on file   Social Connections: Not on file  Intimate Partner Violence: Not on file     Allergies  Allergen Reactions   Atorvastatin Other (See Comments)    Myalgia, couldn't walk   Codeine Nausea And Vomiting     Outpatient Medications Prior to Visit  Medication Sig Dispense Refill   acetaminophen (TYLENOL) 650 MG CR tablet Take 650 mg by mouth every 8 (eight) hours as needed for pain.     albuterol (VENTOLIN HFA) 108 (90 Base) MCG/ACT inhaler Inhale 1-2 puffs into the lungs every 6 (six) hours as needed for wheezing or shortness of breath.     amLODipine (NORVASC) 5 MG tablet Take 5 mg by mouth daily.     aspirin EC 81 MG tablet Take 1 tablet (81 mg total) by mouth daily. Swallow whole. 90 tablet 3   Cholecalciferol (VITAMIN D3) 1.25 MG (50000 UT) CAPS Take 1 capsule by mouth daily.     fluticasone-salmeterol (ADVAIR) 250-50 MCG/ACT AEPB Inhale 1 puff into the lungs 2 (two) times daily.     LANTUS SOLOSTAR 100 UNIT/ML Solostar Pen SMARTSIG:20 Unit(s) SUB-Q Daily     levothyroxine (SYNTHROID) 125 MCG tablet Take 125 mcg by mouth every morning.     pioglitazone (ACTOS) 30 MG tablet Take 30 mg by mouth daily.     rosuvastatin (CRESTOR) 20 MG tablet Take 1 tablet (20 mg total) by mouth daily. 30 tablet 11   budesonide-formoterol (SYMBICORT) 160-4.5 MCG/ACT inhaler Inhale 2 puffs into the lungs 2 (two) times daily. Taking 1x daily     metoprolol tartrate (LOPRESSOR) 50 MG tablet TAKE 1 TABLET 2 HR PRIOR TO CARDIAC PROCEDURE 1 tablet 0   Tiotropium Bromide Monohydrate (SPIRIVA RESPIMAT) 2.5 MCG/ACT AERS Inhale 2 puffs into the lungs daily. 4 g 11   No facility-administered medications prior to visit.   Review of Systems  Constitutional:  Negative for chills, fever, malaise/fatigue and weight loss.  HENT:  Negative for congestion, sinus pain and sore throat.   Eyes: Negative.   Respiratory:  Negative for cough, hemoptysis, sputum production, shortness of breath and wheezing.   Cardiovascular:   Negative for chest pain, palpitations, orthopnea, claudication and leg swelling.  Gastrointestinal:  Negative for abdominal pain, heartburn, nausea and vomiting.  Genitourinary: Negative.   Musculoskeletal:  Negative for joint pain and myalgias.  Skin:  Negative for rash.  Neurological:  Negative for weakness.  Endo/Heme/Allergies: Negative.   Psychiatric/Behavioral: Negative.      Objective:   Vitals:   12/26/22 1049  BP: 128/84  Pulse: 88  SpO2: 98%  Weight: 228 lb (103.4 kg)  Height: 5' (1.524 m)    Physical Exam Constitutional:      General: She is not in acute distress.    Appearance: She is obese. She is not ill-appearing.  HENT:  Head: Normocephalic and atraumatic.  Eyes:     General: No scleral icterus.    Conjunctiva/sclera: Conjunctivae normal.  Cardiovascular:     Rate and Rhythm: Normal rate and regular rhythm.     Pulses: Normal pulses.     Heart sounds: Normal heart sounds. No murmur heard. Pulmonary:     Effort: Pulmonary effort is normal.     Breath sounds: Normal breath sounds. No wheezing, rhonchi or rales.  Musculoskeletal:     Right lower leg: No edema.     Left lower leg: No edema.  Skin:    General: Skin is warm and dry.  Neurological:     Mental Status: She is alert.    CBC No results found for: "WBC", "RBC", "HGB", "HCT", "PLT", "MCV", "MCH", "MCHC", "RDW", "LYMPHSABS", "MONOABS", "EOSABS", "BASOSABS"     Latest Ref Rng & Units 10/28/2021    2:56 PM  BMP  Glucose 70 - 99 mg/dL 409   BUN 8 - 27 mg/dL 11   Creatinine 8.11 - 1.00 mg/dL 9.14   BUN/Creat Ratio 12 - 28 14   Sodium 134 - 144 mmol/L 140   Potassium 3.5 - 5.2 mmol/L 4.1   Chloride 96 - 106 mmol/L 98   CO2 20 - 29 mmol/L 29   Calcium 8.7 - 10.3 mg/dL 78.2    Chest imaging: CXR 10/07/21 Cardiomediastinal silhouette unchanged in size and contour.   Improved aeration at the lung bases from the prior, with no new confluent airspace disease. No pneumothorax or pleural  effusion.   Coarsened interstitial markings.  No interlobular septal thickening.   Degenerative changes spine.  No acute displaced fracture  PFT:    Latest Ref Rng & Units 10/07/2021   11:53 AM  PFT Results  FVC-Pre L 1.60   FVC-Predicted Pre % 64   FVC-Post L 1.54   FVC-Predicted Post % 62   Pre FEV1/FVC % % 66   Post FEV1/FCV % % 73   FEV1-Pre L 1.06   FEV1-Predicted Pre % 57   FEV1-Post L 1.12   DLCO uncorrected ml/min/mmHg 16.52   DLCO UNC% % 96   DLCO corrected ml/min/mmHg 16.52   DLCO COR %Predicted % 96   DLVA Predicted % 127   TLC L 4.45   TLC % Predicted % 98   RV % Predicted % 116     Labs:  Path:  Echo:  Heart Catheterization:  HST 09/17/21 AHI 24.3/hr, moderate obstructive sleep apnea  Assessment & Plan:   Asthma-COPD overlap syndrome  OSA (obstructive sleep apnea)  Discussion: Gabrielle Randall is a 77 year old woman, former smoker with history of asthma, obesity, DMII and hypertension who returns to pulmonary clinic for COPD.    She has non-specific PFT pattern with reduced FEV1 and FVC but normal TLC. She does have reversible ratio with bronchodilator therapy. I believe her symptoms and PFT pattern are most consistent with asthma-copd overlap syndrome.   She is to continue advair 250-85mcg 1 puff twice daily along with spiriva 2.57mcg 2 puffs daily.  She did not tolerate CPAP therapy despite multiple attempts with various masks.   Follow up in 1 year.  Melody Comas, MD [facility] Office: [phone]   Current Outpatient Medications:    acetaminophen (TYLENOL) 650 MG CR tablet, Take 650 mg by mouth every 8 (eight) hours as needed for pain., Disp: , Rfl:    albuterol (VENTOLIN HFA) 108 (90 Base) MCG/ACT inhaler, Inhale 1-2 puffs into the  lungs every 6 (six) hours as needed for wheezing or shortness of breath., Disp: , Rfl:    amLODipine (NORVASC) 5 MG tablet, Take 5 mg by mouth daily., Disp: , Rfl:    aspirin EC 81  MG tablet, Take 1 tablet (81 mg total) by mouth daily. Swallow whole., Disp: 90 tablet, Rfl: 3   Cholecalciferol (VITAMIN D3) 1.25 MG (50000 UT) CAPS, Take 1 capsule by mouth daily., Disp: , Rfl:    fluticasone-salmeterol (ADVAIR) 250-50 MCG/ACT AEPB, Inhale 1 puff into the lungs 2 (two) times daily., Disp: , Rfl:    LANTUS SOLOSTAR 100 UNIT/ML Solostar Pen, SMARTSIG:20 Unit(s) SUB-Q Daily, Disp: , Rfl:    levothyroxine (SYNTHROID) 125 MCG tablet, Take 125 mcg by mouth every morning., Disp: , Rfl:    pioglitazone (ACTOS) 30 MG tablet, Take 30 mg by mouth daily., Disp: , Rfl:    rosuvastatin (CRESTOR) 20 MG tablet, Take 1 tablet (20 mg total) by mouth daily., Disp: 30 tablet, Rfl: 11

## 2023-01-15 NOTE — Progress Notes (Signed)
[facility])  [facility] Quality Pharmacy Team   01/15/2023  Gabrielle Randall 02/04/1946 [id number]  Reason for referral: Medication assistance with [facility]  Referral source: Dr. Duanne Guess Current insurance: [employer] Ad[employer]age  Outreach:  Successful telephone call with patient.  HIPAA identifiers verified. Patient has an upcoming appointment with Dr. Duanne Guess on Tuesday, she will request to begin PAP process for Ozempic. Per patient, prior authorization for Greggory Keen was denied.    Plan: Will contact patient post-appointment to follow up on Ozempic PAP.  Thank you for allowing [facility] pharmacy to be a part of this patient's care.   Harlon Flor, PharmD Clinical Pharmacist  [facility] [phone]

## 2023-01-20 DIAGNOSIS — J449 Chronic obstructive pulmonary disease, unspecified: Secondary | ICD-10-CM | POA: Diagnosis not present

## 2023-01-20 DIAGNOSIS — E1122 Type 2 diabetes mellitus with diabetic chronic kidney disease: Secondary | ICD-10-CM | POA: Diagnosis not present

## 2023-01-20 DIAGNOSIS — E1165 Type 2 diabetes mellitus with hyperglycemia: Secondary | ICD-10-CM | POA: Diagnosis not present

## 2023-01-20 DIAGNOSIS — I129 Hypertensive chronic kidney disease with stage 1 through stage 4 chronic kidney disease, or unspecified chronic kidney disease: Secondary | ICD-10-CM | POA: Diagnosis not present

## 2023-01-20 DIAGNOSIS — I10 Essential (primary) hypertension: Secondary | ICD-10-CM | POA: Diagnosis not present

## 2023-01-20 DIAGNOSIS — E1121 Type 2 diabetes mellitus with diabetic nephropathy: Secondary | ICD-10-CM | POA: Diagnosis not present

## 2023-01-22 ENCOUNTER — Telehealth: Payer: Self-pay | Admitting: Pharmacy Technician

## 2023-01-22 DIAGNOSIS — Z5986 Financial insecurity: Secondary | ICD-10-CM

## 2023-01-22 NOTE — Progress Notes (Signed)
[facility])                                            [facility] Quality Pharmacy Team    01/22/2023  TAAHIRA BIANCHINI 04/12/1946 [id number]                                      Medication Assistance Referral  Referral From: [facility]   Medication/Company: [employer] Patient application portion:  Mining engineer portion: Faxed  to Dr. Maryelizabeth Rowan Provider address/fax verified via: Office website  Ferd Horrigan P. Sadiq Mccauley, CPhT [facility]  [phone]

## 2023-02-12 ENCOUNTER — Telehealth: Payer: Self-pay | Admitting: Pharmacy Technician

## 2023-02-12 DIAGNOSIS — Z5986 Financial insecurity: Secondary | ICD-10-CM

## 2023-02-12 NOTE — Progress Notes (Signed)
[facility])                                            [facility] Quality Pharmacy Team    02/12/2023  Gabrielle Randall 07-16-1946 [id number]  Received both patient and provider portion(s) of patient assistance application(s) for Ozempic. Faxed completed application and required documents into [employer].   Pattricia Boss, CPhT [facility] Office: [phone] Fax: [phone] Email: [email or web address]

## 2023-02-17 ENCOUNTER — Telehealth: Payer: Self-pay | Admitting: Pharmacy Technician

## 2023-02-17 DIAGNOSIS — I10 Essential (primary) hypertension: Secondary | ICD-10-CM | POA: Diagnosis not present

## 2023-02-17 DIAGNOSIS — E1165 Type 2 diabetes mellitus with hyperglycemia: Secondary | ICD-10-CM | POA: Diagnosis not present

## 2023-02-17 DIAGNOSIS — E782 Mixed hyperlipidemia: Secondary | ICD-10-CM | POA: Diagnosis not present

## 2023-02-17 DIAGNOSIS — Z5986 Financial insecurity: Secondary | ICD-10-CM

## 2023-02-17 NOTE — Progress Notes (Signed)
[facility])                                            [facility] Quality Pharmacy Team    02/17/2023  Gabrielle Randall 1946-05-18 [id number]  Care coordination call placed to [employer] in regard to Ozempic application.  Spoke to Isha who informs patient is APPROVED 02/16/23-08/25/23. Medication will auto ship and deliver to provider's office. Subsequent refills will also auto fill and ship to provider's office. Patient may call [employer] if feel current supply is not sufficient until next scheduled shipment by calling [phone].  Pattricia Boss, CPhT [facility] Office: [phone] Fax: [phone] Email: [email or web address]

## 2023-02-23 DIAGNOSIS — E039 Hypothyroidism, unspecified: Secondary | ICD-10-CM | POA: Diagnosis not present

## 2023-02-23 DIAGNOSIS — E1165 Type 2 diabetes mellitus with hyperglycemia: Secondary | ICD-10-CM | POA: Diagnosis not present

## 2023-02-23 DIAGNOSIS — E559 Vitamin D deficiency, unspecified: Secondary | ICD-10-CM | POA: Diagnosis not present

## 2023-02-23 DIAGNOSIS — E782 Mixed hyperlipidemia: Secondary | ICD-10-CM | POA: Diagnosis not present

## 2023-03-02 DIAGNOSIS — Z789 Other specified health status: Secondary | ICD-10-CM | POA: Diagnosis not present

## 2023-03-02 DIAGNOSIS — E782 Mixed hyperlipidemia: Secondary | ICD-10-CM | POA: Diagnosis not present

## 2023-03-02 DIAGNOSIS — E1165 Type 2 diabetes mellitus with hyperglycemia: Secondary | ICD-10-CM | POA: Diagnosis not present

## 2023-03-02 DIAGNOSIS — E669 Obesity, unspecified: Secondary | ICD-10-CM | POA: Diagnosis not present

## 2023-03-02 DIAGNOSIS — E039 Hypothyroidism, unspecified: Secondary | ICD-10-CM | POA: Diagnosis not present

## 2023-03-02 DIAGNOSIS — I129 Hypertensive chronic kidney disease with stage 1 through stage 4 chronic kidney disease, or unspecified chronic kidney disease: Secondary | ICD-10-CM | POA: Diagnosis not present

## 2023-03-02 DIAGNOSIS — E559 Vitamin D deficiency, unspecified: Secondary | ICD-10-CM | POA: Diagnosis not present

## 2023-03-02 DIAGNOSIS — I10 Essential (primary) hypertension: Secondary | ICD-10-CM | POA: Diagnosis not present

## 2023-03-31 DIAGNOSIS — I10 Essential (primary) hypertension: Secondary | ICD-10-CM | POA: Diagnosis not present

## 2023-03-31 DIAGNOSIS — E1165 Type 2 diabetes mellitus with hyperglycemia: Secondary | ICD-10-CM | POA: Diagnosis not present

## 2023-04-08 DIAGNOSIS — Z1331 Encounter for screening for depression: Secondary | ICD-10-CM | POA: Diagnosis not present

## 2023-04-08 DIAGNOSIS — Z1339 Encounter for screening examination for other mental health and behavioral disorders: Secondary | ICD-10-CM | POA: Diagnosis not present

## 2023-04-08 DIAGNOSIS — Z0000 Encounter for general adult medical examination without abnormal findings: Secondary | ICD-10-CM | POA: Diagnosis not present

## 2023-06-15 DIAGNOSIS — E785 Hyperlipidemia, unspecified: Secondary | ICD-10-CM | POA: Diagnosis not present

## 2023-06-15 DIAGNOSIS — I10 Essential (primary) hypertension: Secondary | ICD-10-CM | POA: Diagnosis not present

## 2023-06-15 DIAGNOSIS — R5383 Other fatigue: Secondary | ICD-10-CM | POA: Diagnosis not present

## 2023-06-15 DIAGNOSIS — E559 Vitamin D deficiency, unspecified: Secondary | ICD-10-CM | POA: Diagnosis not present

## 2023-06-15 DIAGNOSIS — E1165 Type 2 diabetes mellitus with hyperglycemia: Secondary | ICD-10-CM | POA: Diagnosis not present

## 2023-06-15 DIAGNOSIS — E039 Hypothyroidism, unspecified: Secondary | ICD-10-CM | POA: Diagnosis not present

## 2023-06-15 DIAGNOSIS — E782 Mixed hyperlipidemia: Secondary | ICD-10-CM | POA: Diagnosis not present

## 2023-06-24 DIAGNOSIS — E1165 Type 2 diabetes mellitus with hyperglycemia: Secondary | ICD-10-CM | POA: Diagnosis not present

## 2023-06-24 DIAGNOSIS — Z789 Other specified health status: Secondary | ICD-10-CM | POA: Diagnosis not present

## 2023-06-24 DIAGNOSIS — I129 Hypertensive chronic kidney disease with stage 1 through stage 4 chronic kidney disease, or unspecified chronic kidney disease: Secondary | ICD-10-CM | POA: Diagnosis not present

## 2023-06-24 DIAGNOSIS — E559 Vitamin D deficiency, unspecified: Secondary | ICD-10-CM | POA: Diagnosis not present

## 2023-06-24 DIAGNOSIS — E1122 Type 2 diabetes mellitus with diabetic chronic kidney disease: Secondary | ICD-10-CM | POA: Diagnosis not present

## 2023-06-24 DIAGNOSIS — T887XXA Unspecified adverse effect of drug or medicament, initial encounter: Secondary | ICD-10-CM | POA: Diagnosis not present

## 2023-06-24 DIAGNOSIS — E11319 Type 2 diabetes mellitus with unspecified diabetic retinopathy without macular edema: Secondary | ICD-10-CM | POA: Diagnosis not present

## 2023-06-24 DIAGNOSIS — E782 Mixed hyperlipidemia: Secondary | ICD-10-CM | POA: Diagnosis not present

## 2023-06-24 DIAGNOSIS — Z23 Encounter for immunization: Secondary | ICD-10-CM | POA: Diagnosis not present

## 2023-06-24 DIAGNOSIS — E039 Hypothyroidism, unspecified: Secondary | ICD-10-CM | POA: Diagnosis not present

## 2023-06-26 DIAGNOSIS — H2513 Age-related nuclear cataract, bilateral: Secondary | ICD-10-CM | POA: Diagnosis not present

## 2023-06-26 DIAGNOSIS — H524 Presbyopia: Secondary | ICD-10-CM | POA: Diagnosis not present

## 2023-07-14 ENCOUNTER — Emergency Department (HOSPITAL_BASED_OUTPATIENT_CLINIC_OR_DEPARTMENT_OTHER): Payer: PPO | Admitting: Radiology

## 2023-07-14 ENCOUNTER — Other Ambulatory Visit: Payer: Self-pay

## 2023-07-14 ENCOUNTER — Emergency Department (HOSPITAL_BASED_OUTPATIENT_CLINIC_OR_DEPARTMENT_OTHER)
Admission: EM | Admit: 2023-07-14 | Discharge: 2023-07-14 | Disposition: A | Discharge status: Home / Self Care | Payer: PPO | Attending: Emergency Medicine | Admitting: Emergency Medicine

## 2023-07-14 ENCOUNTER — Emergency Department (HOSPITAL_BASED_OUTPATIENT_CLINIC_OR_DEPARTMENT_OTHER): Payer: PPO

## 2023-07-14 ENCOUNTER — Encounter (HOSPITAL_BASED_OUTPATIENT_CLINIC_OR_DEPARTMENT_OTHER): Payer: Self-pay

## 2023-07-14 ENCOUNTER — Other Ambulatory Visit: Payer: Self-pay | Admitting: Family Medicine

## 2023-07-14 DIAGNOSIS — I10 Essential (primary) hypertension: Secondary | ICD-10-CM | POA: Insufficient documentation

## 2023-07-14 DIAGNOSIS — J984 Other disorders of lung: Secondary | ICD-10-CM | POA: Diagnosis not present

## 2023-07-14 DIAGNOSIS — Z1152 Encounter for screening for COVID-19: Secondary | ICD-10-CM | POA: Diagnosis not present

## 2023-07-14 DIAGNOSIS — R0602 Shortness of breath: Secondary | ICD-10-CM | POA: Diagnosis not present

## 2023-07-14 DIAGNOSIS — J189 Pneumonia, unspecified organism: Secondary | ICD-10-CM | POA: Diagnosis not present

## 2023-07-14 DIAGNOSIS — K8020 Calculus of gallbladder without cholecystitis without obstruction: Secondary | ICD-10-CM | POA: Diagnosis not present

## 2023-07-14 DIAGNOSIS — E119 Type 2 diabetes mellitus without complications: Secondary | ICD-10-CM | POA: Insufficient documentation

## 2023-07-14 DIAGNOSIS — Z6841 Body Mass Index (BMI) 40.0 and over, adult: Secondary | ICD-10-CM | POA: Insufficient documentation

## 2023-07-14 DIAGNOSIS — Z794 Long term (current) use of insulin: Secondary | ICD-10-CM | POA: Diagnosis not present

## 2023-07-14 DIAGNOSIS — H1031 Unspecified acute conjunctivitis, right eye: Secondary | ICD-10-CM | POA: Insufficient documentation

## 2023-07-14 DIAGNOSIS — E669 Obesity, unspecified: Secondary | ICD-10-CM | POA: Diagnosis not present

## 2023-07-14 DIAGNOSIS — Z7982 Long term (current) use of aspirin: Secondary | ICD-10-CM | POA: Insufficient documentation

## 2023-07-14 DIAGNOSIS — R059 Cough, unspecified: Secondary | ICD-10-CM | POA: Diagnosis present

## 2023-07-14 DIAGNOSIS — J168 Pneumonia due to other specified infectious organisms: Secondary | ICD-10-CM | POA: Diagnosis not present

## 2023-07-14 DIAGNOSIS — J449 Chronic obstructive pulmonary disease, unspecified: Secondary | ICD-10-CM | POA: Diagnosis not present

## 2023-07-14 DIAGNOSIS — J45909 Unspecified asthma, uncomplicated: Secondary | ICD-10-CM | POA: Insufficient documentation

## 2023-07-14 DIAGNOSIS — I2510 Atherosclerotic heart disease of native coronary artery without angina pectoris: Secondary | ICD-10-CM | POA: Diagnosis not present

## 2023-07-14 DIAGNOSIS — R918 Other nonspecific abnormal finding of lung field: Secondary | ICD-10-CM | POA: Diagnosis not present

## 2023-07-14 DIAGNOSIS — M545 Low back pain, unspecified: Secondary | ICD-10-CM | POA: Diagnosis not present

## 2023-07-14 LAB — CBC WITH DIFFERENTIAL/PLATELET
Abs Immature Granulocytes: 0.06 10*3/uL (ref 0.00–0.07)
Basophils Absolute: 0 10*3/uL (ref 0.0–0.1)
Basophils Relative: 0 %
Eosinophils Absolute: 0.2 10*3/uL (ref 0.0–0.5)
Eosinophils Relative: 2 %
HCT: 41 % (ref 36.0–46.0)
Hemoglobin: 13.8 g/dL (ref 12.0–15.0)
Immature Granulocytes: 1 %
Lymphocytes Relative: 19 %
Lymphs Abs: 2 10*3/uL (ref 0.7–4.0)
MCH: 29.7 pg (ref 26.0–34.0)
MCHC: 33.7 g/dL (ref 30.0–36.0)
MCV: 88.4 fL (ref 80.0–100.0)
Monocytes Absolute: 0.8 10*3/uL (ref 0.1–1.0)
Monocytes Relative: 8 %
Neutro Abs: 7 10*3/uL (ref 1.7–7.7)
Neutrophils Relative %: 70 %
Platelets: 266 10*3/uL (ref 150–400)
RBC: 4.64 MIL/uL (ref 3.87–5.11)
RDW: 12.8 % (ref 11.5–15.5)
WBC: 10.1 10*3/uL (ref 4.0–10.5)
nRBC: 0 % (ref 0.0–0.2)

## 2023-07-14 LAB — RESP PANEL BY RT-PCR (RSV, FLU A&B, COVID)  RVPGX2
Influenza A by PCR: NEGATIVE
Influenza B by PCR: NEGATIVE
Resp Syncytial Virus by PCR: NEGATIVE
SARS Coronavirus 2 by RT PCR: NEGATIVE

## 2023-07-14 LAB — COMPREHENSIVE METABOLIC PANEL
ALT: 15 U/L (ref 0–44)
AST: 14 U/L — ABNORMAL LOW (ref 15–41)
Albumin: 3.7 g/dL (ref 3.5–5.0)
Alkaline Phosphatase: 99 U/L (ref 38–126)
Anion gap: 12 (ref 5–15)
BUN: 11 mg/dL (ref 8–23)
CO2: 27 mmol/L (ref 22–32)
Calcium: 9.8 mg/dL (ref 8.9–10.3)
Chloride: 95 mmol/L — ABNORMAL LOW (ref 98–111)
Creatinine, Ser: 0.79 mg/dL (ref 0.44–1.00)
GFR, Estimated: >60 mL/min (ref >60)
Glucose, Bld: 301 mg/dL — ABNORMAL HIGH (ref 70–99)
Potassium: 3.1 mmol/L — ABNORMAL LOW (ref 3.5–5.1)
Sodium: 134 mmol/L — ABNORMAL LOW (ref 135–145)
Total Bilirubin: 0.9 mg/dL (ref <1.2)
Total Protein: 7.6 g/dL (ref 6.5–8.1)

## 2023-07-14 LAB — D-DIMER, QUANTITATIVE: D-Dimer, Quant: 3.55 ug{FEU}/mL — ABNORMAL HIGH (ref 0.00–0.50)

## 2023-07-14 MED ORDER — IOHEXOL 350 MG/ML SOLN
100.0000 mL | Freq: Once | INTRAVENOUS | Status: AC | PRN
  Administered 2023-07-14: 80 mL via INTRAVENOUS

## 2023-07-14 MED ORDER — ALBUTEROL SULFATE (2.5 MG/3ML) 0.083% IN NEBU
5.0000 mg | INHALATION_SOLUTION | Freq: Once | RESPIRATORY_TRACT | Status: AC
  Administered 2023-07-14: 5 mg via RESPIRATORY_TRACT
  Filled 2023-07-14: qty 6

## 2023-07-14 MED ORDER — ALBUTEROL SULFATE HFA 108 (90 BASE) MCG/ACT IN AERS
2.0000 | INHALATION_SPRAY | RESPIRATORY_TRACT | Status: DC | PRN
  Administered 2023-07-14: 2 via RESPIRATORY_TRACT
  Filled 2023-07-14: qty 6.7

## 2023-07-14 MED ORDER — AZITHROMYCIN 250 MG PO TABS
500.0000 mg | ORAL_TABLET | Freq: Once | ORAL | Status: AC
  Administered 2023-07-14: 500 mg via ORAL

## 2023-07-14 MED ORDER — AZITHROMYCIN 250 MG PO TABS
ORAL_TABLET | ORAL | Status: AC
  Filled 2023-07-14: qty 2

## 2023-07-14 MED ORDER — IPRATROPIUM-ALBUTEROL 0.5-2.5 (3) MG/3ML IN SOLN
3.0000 mL | Freq: Once | RESPIRATORY_TRACT | Status: AC
  Administered 2023-07-14: 3 mL via RESPIRATORY_TRACT
  Filled 2023-07-14: qty 3

## 2023-07-14 MED ORDER — METHYLPREDNISOLONE SODIUM SUCC 125 MG IJ SOLR
125.0000 mg | Freq: Once | INTRAMUSCULAR | Status: AC
  Administered 2023-07-14: 125 mg via INTRAVENOUS
  Filled 2023-07-14: qty 2

## 2023-07-14 MED ORDER — CIPROFLOXACIN HCL 0.3 % OP SOLN: 2.0000 [drp] | OPHTHALMIC | 0 refills | Status: AC

## 2023-07-14 NOTE — ED Notes (Signed)
Recollect light green taken to lab. Extra hold blue provided as well.

## 2023-07-14 NOTE — ED Provider Notes (Signed)
[place] EMERGENCY DEPARTMENT AT [facility] [place] Provider Note   CSN: [id number] Arrival date & time: 07/14/23  1630     History  Chief Complaint  Patient presents with   Shortness of Breath    Gabrielle Randall is a 77 y.o. female.  Pt is a 77 yo female with pmhx significant for asthma, DM2, and htn.  Pt said she's had sob and cough for the past few days.  She did see her pcp yesterday and was given a shot of (she thinks) Rocephin.  She denies any fevers.         Home Medications Prior to Admission medications   Medication Sig Start Date End Date Taking? Authorizing Provider  ciprofloxacin (CILOXAN) 0.3 % ophthalmic solution Place 2 drops into the right eye every 2 (two) hours while awake. 07/14/23  Yes Jacalyn Lefevre, MD  acetaminophen (TYLENOL) 650 MG CR tablet Take 650 mg by mouth every 8 (eight) hours as needed for pain.    [provider]  albuterol (VENTOLIN HFA) 108 (90 Base) MCG/ACT inhaler Inhale 1-2 puffs into the lungs every 6 (six) hours as needed for wheezing or shortness of breath.    [provider]  amLODipine (NORVASC) 5 MG tablet Take 5 mg by mouth daily. 04/09/21   [provider]  aspirin EC 81 MG tablet Take 1 tablet (81 mg total) by mouth daily. Swallow whole. 11/18/21   Jodelle Red, MD  Cholecalciferol (VITAMIN D3) 1.25 MG (50000 UT) CAPS Take 1 capsule by mouth daily. 09/21/21   [provider]  fluticasone-salmeterol (ADVAIR) 250-50 MCG/ACT AEPB Inhale 1 puff into the lungs 2 (two) times daily. 12/17/22   [provider]  LANTUS SOLOSTAR 100 UNIT/ML Solostar Pen SMARTSIG:20 Unit(s) SUB-Q Daily 10/30/21   [provider]  levothyroxine (SYNTHROID) 125 MCG tablet Take 125 mcg by mouth every morning. 04/09/21   [provider]  pioglitazone (ACTOS) 30 MG tablet Take 30 mg by mouth daily. 10/30/21   [provider]  rosuvastatin (CRESTOR) 20 MG tablet Take 1 tablet (20 mg  total) by mouth daily. 11/18/21 11/13/22  Jodelle Red, MD  Tiotropium Bromide Monohydrate (SPIRIVA RESPIMAT) 2.5 MCG/ACT AERS Inhale 2 puffs into the lungs daily. 12/26/22   Martina Sinner, MD  Tiotropium Bromide Monohydrate (SPIRIVA RESPIMAT) 2.5 MCG/ACT AERS Inhale 2 puffs into the lungs daily. 12/26/22   Martina Sinner, MD      Allergies    Atorvastatin and Codeine    Review of Systems   Review of Systems  Respiratory:  Positive for cough, shortness of breath and wheezing.   All other systems reviewed and are negative.   Physical Exam Updated Vital Signs BP (!) 150/72   Pulse (!) 106   Temp 98.1 F (36.7 C)   Resp 17   Ht 5' (1.524 m)   Wt 98.9 kg   SpO2 92%   BMI 42.58 kg/m  Physical Exam Vitals and nursing note reviewed.  Constitutional:      Appearance: She is well-developed. She is obese.  HENT:     Head: Normocephalic and atraumatic.     Mouth/Throat:     Mouth: Mucous membranes are moist.     Pharynx: Oropharynx is clear.  Eyes:     Extraocular Movements: Extraocular movements intact.     Conjunctiva/sclera:     Right eye: Right conjunctiva is injected. Exudate present.     Pupils: Pupils are equal, round, and reactive to light.  Cardiovascular:  Rate and Rhythm: Regular rhythm. Tachycardia present.  Pulmonary:     Effort: Tachypnea present.     Breath sounds: Wheezing present.  Abdominal:     General: Bowel sounds are normal.     Palpations: Abdomen is soft.  Musculoskeletal:        General: Normal range of motion.     Cervical back: Normal range of motion and neck supple.  Skin:    General: Skin is warm.     Capillary Refill: Capillary refill takes less than 2 seconds.  Neurological:     General: No focal deficit present.     Mental Status: She is alert and oriented to person, place, and time.  Psychiatric:        Mood and Affect: Mood normal.        Behavior: Behavior normal.     ED Results / Procedures / Treatments    Labs (all labs ordered are listed, but only abnormal results are displayed) Labs Reviewed  D-DIMER, QUANTITATIVE - Abnormal; Notable for the following components:      Result Value   D-Dimer, Quant 3.55 (*)    All other components within normal limits  COMPREHENSIVE METABOLIC PANEL - Abnormal; Notable for the following components:   Sodium 134 (*)    Potassium 3.1 (*)    Chloride 95 (*)    Glucose, Bld 301 (*)    AST 14 (*)    All other components within normal limits  RESP PANEL BY RT-PCR (RSV, FLU A&B, COVID)  RVPGX2  CBC WITH DIFFERENTIAL/PLATELET    EKG EKG Interpretation Date/Time:  Tuesday July 14 2023 16:52:30 EST Ventricular Rate:  112 PR Interval:  152 QRS Duration:  72 QT Interval:  324 QTC Calculation: 442 R Axis:   4  Text Interpretation: Sinus tachycardia Otherwise normal ECG No previous ECGs available Confirmed by Jacalyn Lefevre [phone]) on 07/14/2023 5:07:52 PM  Radiology CT Angio Chest PE W and/or Wo Contrast  Result Date: 07/14/2023 CLINICAL DATA:  Pulmonary embolism (PE) suspected, high prob. Shortness of breath. EXAM: CT ANGIOGRAPHY CHEST WITH CONTRAST TECHNIQUE: Multidetector CT imaging of the chest was performed using the standard protocol during bolus administration of intravenous contrast. Multiplanar CT image reconstructions and MIPs were obtained to evaluate the vascular anatomy. RADIATION DOSE REDUCTION: This exam was performed according to the departmental dose-optimization program which includes automated exposure control, adjustment of the mA and/or kV according to patient size and/or use of iterative reconstruction technique. CONTRAST:  80mL OMNIPAQUE IOHEXOL 350 MG/ML SOLN COMPARISON:  11/08/2021 FINDINGS: Cardiovascular: No filling defects in the pulmonary arteries to suggest pulmonary emboli. Pulmonary arteries in the lung bases obscured by respiratory motion. Heart is normal size. Aorta is normal caliber. Moderate coronary artery and aortic  atherosclerosis. Mediastinum/Nodes: No mediastinal, hilar, or axillary adenopathy. Trachea and esophagus are unremarkable. Thyroid unremarkable. Lungs/Pleura: Patchy airspace disease in the right lower lobe and adjacent right middle lobe. Airspace disease in the lingula and left upper lobe as well. Findings concerning for multifocal pneumonia. No effusions. Upper Abdomen: Gallstones noted within the gallbladder. No acute findings. Musculoskeletal: Chest wall soft tissues are unremarkable. No acute bony abnormality. Review of the MIP images confirms the above findings. IMPRESSION: Airspace disease in the right lower lobe, right middle lobe and left upper lobe concerning for multifocal pneumonia. No evidence of pulmonary embolus. Coronary artery disease. Cholelithiasis. Aortic Atherosclerosis (ICD10-I70.0). Electronically Signed   By: Charlett Nose M.D.   On: 07/14/2023 21:27   DG Chest 2 View  Result Date: 07/14/2023 CLINICAL DATA:  Shortness of breath EXAM: CHEST - 2 VIEW COMPARISON:  10/07/2021 FINDINGS: Heart and mediastinal contours within normal limits. Left lung clear. Patchy airspace disease at the right lung base concerning for pneumonia. No effusions or acute bony abnormality. IMPRESSION: Patchy right lower lobe opacity concerning for pneumonia. Electronically Signed   By: Charlett Nose M.D.   On: 07/14/2023 21:24    Procedures Procedures    Medications Ordered in ED Medications  albuterol (VENTOLIN HFA) 108 (90 Base) MCG/ACT inhaler 2 puff (2 puffs Inhalation Provided for home use 07/14/23 1753)  azithromycin (ZITHROMAX) tablet 500 mg (has no administration in time range)  ipratropium-albuterol (DUONEB) 0.5-2.5 (3) MG/3ML nebulizer solution 3 mL (3 mLs Nebulization Given 07/14/23 1649)  albuterol (PROVENTIL) (2.5 MG/3ML) 0.083% nebulizer solution 5 mg (5 mg Nebulization Given 07/14/23 1725)  methylPREDNISolone sodium succinate (SOLU-MEDROL) 125 mg/2 mL injection 125 mg (125 mg Intravenous  Given 07/14/23 1745)  iohexol (OMNIPAQUE) 350 MG/ML injection 100 mL (80 mLs Intravenous Contrast Given 07/14/23 1943)    ED Course/ Medical Decision Making/ A&P                                 Medical Decision Making Amount and/or Complexity of Data Reviewed Labs: ordered. Radiology: ordered.  Risk Prescription drug management.   This patient presents to the ED for concern of sob, this involves an extensive number of treatment options, and is a complaint that carries with it a high risk of complications and morbidity.  The differential diagnosis includes pna, covid/flu/rsv, bronchitis, PE   Co morbidities that complicate the patient evaluation  asthma, DM2, and htn   Additional history obtained:  Additional history obtained from epic chart review External records from outside source obtained and reviewed including husband   Lab Tests:  I Ordered, and personally interpreted labs.  The pertinent results include:  covid/flu/rsv neg, cmp with k low at 3.1, glucose elevated at 301, ddimer elevated at 3.55   Imaging Studies ordered:  I ordered imaging studies including cxr and ct chest I independently visualized and interpreted imaging which showed  CXR: Patchy right lower lobe opacity concerning for pneumonia.  CT chest: Airspace disease in the right lower lobe, right middle lobe and left  upper lobe concerning for multifocal pneumonia.    No evidence of pulmonary embolus.    Coronary artery disease.    Cholelithiasis.    Aortic Atherosclerosis (ICD10-I70.0).   I agree with the radiologist interpretation   Cardiac Monitoring:  The patient was maintained on a cardiac monitor.  I personally viewed and interpreted the cardiac monitored which showed an underlying rhythm of: st   Medicines ordered and prescription drug management:  I ordered medication including solumedrol/nebs  for sx  Reevaluation of the patient after these medicines showed that the patient  improved I have reviewed the patients home medicines and have made adjustments as needed   Test Considered:  ct   Critical Interventions:  nebs   Problem List / ED Course:  Multifocal pneumonia:  pt is feeling much better.  When she sleeps, her O2 sats drop down to the upper 80s.  I did want her to stay for admission.  However, she wants to go home.  Pt did have a dose of Rocephin today at pcp's office.  She is given zithromax orally here.  PCP sent in a rx for levaquin.  Pt has an appt  with pcp tomorrow for a recheck.  Pt's husband said he'd bring her back if she worsens.   Conjunctivitis:  ciloxan sent in   Reevaluation:  After the interventions noted above, I reevaluated the patient and found that they have :improved   Social Determinants of Health:  Lives at home   Dispostion:  After consideration of the diagnostic results and the patients response to treatment, I feel that the patent would benefit from discharge with outpatient f/u.          Final Clinical Impression(s) / ED Diagnoses Final diagnoses:  Acute bacterial conjunctivitis of right eye  Multifocal pneumonia    Rx / DC Orders ED Discharge Orders          Ordered    ciprofloxacin (CILOXAN) 0.3 % ophthalmic solution  Every 2 hours while awake        07/14/23 2134              Jacalyn Lefevre, MD 07/14/23 2137

## 2023-07-14 NOTE — ED Triage Notes (Addendum)
Pt POV reporting increased SOB past few days, hx asthma. Labored and diminished in triage. Reports cold/flu sx last week that has mostly resolved

## 2023-07-15 DIAGNOSIS — J441 Chronic obstructive pulmonary disease with (acute) exacerbation: Secondary | ICD-10-CM | POA: Diagnosis not present

## 2023-07-15 DIAGNOSIS — J189 Pneumonia, unspecified organism: Secondary | ICD-10-CM | POA: Diagnosis not present

## 2023-07-15 DIAGNOSIS — H109 Unspecified conjunctivitis: Secondary | ICD-10-CM | POA: Diagnosis not present

## 2023-07-15 DIAGNOSIS — J449 Chronic obstructive pulmonary disease, unspecified: Secondary | ICD-10-CM | POA: Diagnosis not present

## 2023-07-17 DIAGNOSIS — I10 Essential (primary) hypertension: Secondary | ICD-10-CM | POA: Diagnosis not present

## 2023-07-17 DIAGNOSIS — J189 Pneumonia, unspecified organism: Secondary | ICD-10-CM | POA: Diagnosis not present

## 2023-07-17 DIAGNOSIS — J449 Chronic obstructive pulmonary disease, unspecified: Secondary | ICD-10-CM | POA: Diagnosis not present

## 2023-08-17 DIAGNOSIS — G4733 Obstructive sleep apnea (adult) (pediatric): Secondary | ICD-10-CM | POA: Diagnosis not present

## 2023-08-17 DIAGNOSIS — J189 Pneumonia, unspecified organism: Secondary | ICD-10-CM | POA: Diagnosis not present

## 2023-08-17 DIAGNOSIS — I10 Essential (primary) hypertension: Secondary | ICD-10-CM | POA: Diagnosis not present

## 2023-08-17 DIAGNOSIS — J449 Chronic obstructive pulmonary disease, unspecified: Secondary | ICD-10-CM | POA: Diagnosis not present

## 2023-08-17 DIAGNOSIS — M170 Bilateral primary osteoarthritis of knee: Secondary | ICD-10-CM | POA: Diagnosis not present

## 2023-09-14 DIAGNOSIS — E785 Hyperlipidemia, unspecified: Secondary | ICD-10-CM | POA: Diagnosis not present

## 2023-09-14 DIAGNOSIS — R739 Hyperglycemia, unspecified: Secondary | ICD-10-CM | POA: Diagnosis not present

## 2023-09-14 DIAGNOSIS — I10 Essential (primary) hypertension: Secondary | ICD-10-CM | POA: Diagnosis not present

## 2023-09-17 ENCOUNTER — Telehealth: Payer: Self-pay

## 2023-09-17 DIAGNOSIS — E782 Mixed hyperlipidemia: Secondary | ICD-10-CM | POA: Diagnosis not present

## 2023-09-17 DIAGNOSIS — Z789 Other specified health status: Secondary | ICD-10-CM | POA: Diagnosis not present

## 2023-09-17 DIAGNOSIS — E1165 Type 2 diabetes mellitus with hyperglycemia: Secondary | ICD-10-CM | POA: Diagnosis not present

## 2023-09-17 DIAGNOSIS — E1169 Type 2 diabetes mellitus with other specified complication: Secondary | ICD-10-CM | POA: Diagnosis not present

## 2023-09-17 DIAGNOSIS — G720 Drug-induced myopathy: Secondary | ICD-10-CM | POA: Diagnosis not present

## 2023-09-17 DIAGNOSIS — I10 Essential (primary) hypertension: Secondary | ICD-10-CM | POA: Diagnosis not present

## 2023-09-17 DIAGNOSIS — E1121 Type 2 diabetes mellitus with diabetic nephropathy: Secondary | ICD-10-CM | POA: Diagnosis not present

## 2023-09-17 NOTE — Progress Notes (Signed)
   09/17/2023  Patient ID: Gabrielle Randall, female   DOB: 05-23-1946, 78 y.o.   MRN: [id number]    2025 Medication Assistance Renewal Application Summary:  Patient was outreached regarding medication assistance renewal for 2025. Verified address, anticipated insurance for 2025, and income has not changed. Patient does not remain interested in PAP for 2025 for Ozempic, no other new medications were identified for medication assistance.    Medication Assistance Findings:  No medication assistance needs identified     Additional medication assistance options reviewed with patient as warranted:  No other options identified  Plan: I will close this PAP re-enrollment patient case.  Thank you for allowing pharmacy to be a part of this patient's care.  Harlon Flor, PharmD Clinical Pharmacist  [phone]

## 2023-10-26 DIAGNOSIS — M791 Myalgia, unspecified site: Secondary | ICD-10-CM | POA: Diagnosis not present

## 2023-10-26 DIAGNOSIS — E785 Hyperlipidemia, unspecified: Secondary | ICD-10-CM | POA: Diagnosis not present

## 2023-10-29 DIAGNOSIS — E782 Mixed hyperlipidemia: Secondary | ICD-10-CM | POA: Diagnosis not present

## 2023-10-29 DIAGNOSIS — E1121 Type 2 diabetes mellitus with diabetic nephropathy: Secondary | ICD-10-CM | POA: Diagnosis not present

## 2023-10-29 DIAGNOSIS — E1165 Type 2 diabetes mellitus with hyperglycemia: Secondary | ICD-10-CM | POA: Diagnosis not present

## 2023-10-29 DIAGNOSIS — E1122 Type 2 diabetes mellitus with diabetic chronic kidney disease: Secondary | ICD-10-CM | POA: Diagnosis not present

## 2023-10-29 DIAGNOSIS — I10 Essential (primary) hypertension: Secondary | ICD-10-CM | POA: Diagnosis not present

## 2023-10-29 DIAGNOSIS — J45998 Other asthma: Secondary | ICD-10-CM | POA: Diagnosis not present

## 2023-10-29 DIAGNOSIS — I129 Hypertensive chronic kidney disease with stage 1 through stage 4 chronic kidney disease, or unspecified chronic kidney disease: Secondary | ICD-10-CM | POA: Diagnosis not present

## 2023-10-29 DIAGNOSIS — J449 Chronic obstructive pulmonary disease, unspecified: Secondary | ICD-10-CM | POA: Diagnosis not present

## 2023-10-29 DIAGNOSIS — J441 Chronic obstructive pulmonary disease with (acute) exacerbation: Secondary | ICD-10-CM | POA: Diagnosis not present

## 2023-10-29 DIAGNOSIS — M170 Bilateral primary osteoarthritis of knee: Secondary | ICD-10-CM | POA: Diagnosis not present

## 2023-10-29 DIAGNOSIS — Z789 Other specified health status: Secondary | ICD-10-CM | POA: Diagnosis not present

## 2023-10-29 DIAGNOSIS — J45909 Unspecified asthma, uncomplicated: Secondary | ICD-10-CM | POA: Diagnosis not present

## 2023-11-17 DIAGNOSIS — Z13220 Encounter for screening for lipoid disorders: Secondary | ICD-10-CM | POA: Diagnosis not present

## 2023-11-17 DIAGNOSIS — Z0000 Encounter for general adult medical examination without abnormal findings: Secondary | ICD-10-CM | POA: Diagnosis not present

## 2023-11-26 DIAGNOSIS — Z0000 Encounter for general adult medical examination without abnormal findings: Secondary | ICD-10-CM | POA: Diagnosis not present

## 2023-11-29 DIAGNOSIS — J441 Chronic obstructive pulmonary disease with (acute) exacerbation: Secondary | ICD-10-CM | POA: Diagnosis not present

## 2023-11-29 DIAGNOSIS — J45909 Unspecified asthma, uncomplicated: Secondary | ICD-10-CM | POA: Diagnosis not present

## 2023-11-29 DIAGNOSIS — J45998 Other asthma: Secondary | ICD-10-CM | POA: Diagnosis not present

## 2023-11-29 DIAGNOSIS — J449 Chronic obstructive pulmonary disease, unspecified: Secondary | ICD-10-CM | POA: Diagnosis not present

## 2023-12-03 DIAGNOSIS — I10 Essential (primary) hypertension: Secondary | ICD-10-CM | POA: Diagnosis not present

## 2023-12-03 DIAGNOSIS — M170 Bilateral primary osteoarthritis of knee: Secondary | ICD-10-CM | POA: Diagnosis not present

## 2023-12-08 DIAGNOSIS — M170 Bilateral primary osteoarthritis of knee: Secondary | ICD-10-CM | POA: Diagnosis not present

## 2023-12-14 DIAGNOSIS — I10 Essential (primary) hypertension: Secondary | ICD-10-CM | POA: Diagnosis not present

## 2023-12-14 DIAGNOSIS — M170 Bilateral primary osteoarthritis of knee: Secondary | ICD-10-CM | POA: Diagnosis not present

## 2023-12-28 DIAGNOSIS — H2513 Age-related nuclear cataract, bilateral: Secondary | ICD-10-CM | POA: Diagnosis not present

## 2023-12-28 DIAGNOSIS — H353221 Exudative age-related macular degeneration, left eye, with active choroidal neovascularization: Secondary | ICD-10-CM | POA: Diagnosis not present

## 2023-12-29 DIAGNOSIS — J45909 Unspecified asthma, uncomplicated: Secondary | ICD-10-CM | POA: Diagnosis not present

## 2023-12-29 DIAGNOSIS — J441 Chronic obstructive pulmonary disease with (acute) exacerbation: Secondary | ICD-10-CM | POA: Diagnosis not present

## 2023-12-29 DIAGNOSIS — M170 Bilateral primary osteoarthritis of knee: Secondary | ICD-10-CM | POA: Diagnosis not present

## 2023-12-29 DIAGNOSIS — J45998 Other asthma: Secondary | ICD-10-CM | POA: Diagnosis not present

## 2023-12-29 DIAGNOSIS — J449 Chronic obstructive pulmonary disease, unspecified: Secondary | ICD-10-CM | POA: Diagnosis not present

## 2023-12-31 DIAGNOSIS — H353221 Exudative age-related macular degeneration, left eye, with active choroidal neovascularization: Secondary | ICD-10-CM | POA: Diagnosis not present

## 2023-12-31 DIAGNOSIS — H43823 Vitreomacular adhesion, bilateral: Secondary | ICD-10-CM | POA: Diagnosis not present

## 2023-12-31 DIAGNOSIS — E119 Type 2 diabetes mellitus without complications: Secondary | ICD-10-CM | POA: Diagnosis not present

## 2023-12-31 DIAGNOSIS — H35461 Secondary vitreoretinal degeneration, right eye: Secondary | ICD-10-CM | POA: Diagnosis not present

## 2023-12-31 DIAGNOSIS — H353112 Nonexudative age-related macular degeneration, right eye, intermediate dry stage: Secondary | ICD-10-CM | POA: Diagnosis not present

## 2023-12-31 DIAGNOSIS — H2513 Age-related nuclear cataract, bilateral: Secondary | ICD-10-CM | POA: Diagnosis not present

## 2024-01-12 ENCOUNTER — Other Ambulatory Visit: Payer: Self-pay | Admitting: Pulmonary Disease

## 2024-01-27 DIAGNOSIS — R739 Hyperglycemia, unspecified: Secondary | ICD-10-CM | POA: Diagnosis not present

## 2024-01-27 DIAGNOSIS — E559 Vitamin D deficiency, unspecified: Secondary | ICD-10-CM | POA: Diagnosis not present

## 2024-01-27 DIAGNOSIS — E785 Hyperlipidemia, unspecified: Secondary | ICD-10-CM | POA: Diagnosis not present

## 2024-01-27 DIAGNOSIS — R5383 Other fatigue: Secondary | ICD-10-CM | POA: Diagnosis not present

## 2024-01-28 DIAGNOSIS — H35461 Secondary vitreoretinal degeneration, right eye: Secondary | ICD-10-CM | POA: Diagnosis not present

## 2024-01-28 DIAGNOSIS — E119 Type 2 diabetes mellitus without complications: Secondary | ICD-10-CM | POA: Diagnosis not present

## 2024-01-28 DIAGNOSIS — H35033 Hypertensive retinopathy, bilateral: Secondary | ICD-10-CM | POA: Diagnosis not present

## 2024-01-28 DIAGNOSIS — H2513 Age-related nuclear cataract, bilateral: Secondary | ICD-10-CM | POA: Diagnosis not present

## 2024-01-28 DIAGNOSIS — H353221 Exudative age-related macular degeneration, left eye, with active choroidal neovascularization: Secondary | ICD-10-CM | POA: Diagnosis not present

## 2024-01-28 DIAGNOSIS — H43823 Vitreomacular adhesion, bilateral: Secondary | ICD-10-CM | POA: Diagnosis not present

## 2024-01-28 DIAGNOSIS — H353112 Nonexudative age-related macular degeneration, right eye, intermediate dry stage: Secondary | ICD-10-CM | POA: Diagnosis not present

## 2024-01-29 DIAGNOSIS — J441 Chronic obstructive pulmonary disease with (acute) exacerbation: Secondary | ICD-10-CM | POA: Diagnosis not present

## 2024-01-29 DIAGNOSIS — J45909 Unspecified asthma, uncomplicated: Secondary | ICD-10-CM | POA: Diagnosis not present

## 2024-01-29 DIAGNOSIS — J449 Chronic obstructive pulmonary disease, unspecified: Secondary | ICD-10-CM | POA: Diagnosis not present

## 2024-01-29 DIAGNOSIS — J45998 Other asthma: Secondary | ICD-10-CM | POA: Diagnosis not present

## 2024-02-03 DIAGNOSIS — E1165 Type 2 diabetes mellitus with hyperglycemia: Secondary | ICD-10-CM | POA: Diagnosis not present

## 2024-02-03 DIAGNOSIS — Z7985 Long-term (current) use of injectable non-insulin antidiabetic drugs: Secondary | ICD-10-CM | POA: Diagnosis not present

## 2024-02-03 DIAGNOSIS — I129 Hypertensive chronic kidney disease with stage 1 through stage 4 chronic kidney disease, or unspecified chronic kidney disease: Secondary | ICD-10-CM | POA: Diagnosis not present

## 2024-02-03 DIAGNOSIS — E1121 Type 2 diabetes mellitus with diabetic nephropathy: Secondary | ICD-10-CM | POA: Diagnosis not present

## 2024-02-03 DIAGNOSIS — E1169 Type 2 diabetes mellitus with other specified complication: Secondary | ICD-10-CM | POA: Diagnosis not present

## 2024-02-03 DIAGNOSIS — J4540 Moderate persistent asthma, uncomplicated: Secondary | ICD-10-CM | POA: Diagnosis not present

## 2024-02-03 DIAGNOSIS — I152 Hypertension secondary to endocrine disorders: Secondary | ICD-10-CM | POA: Diagnosis not present

## 2024-02-03 DIAGNOSIS — H353290 Exudative age-related macular degeneration, unspecified eye, stage unspecified: Secondary | ICD-10-CM | POA: Diagnosis not present

## 2024-02-03 DIAGNOSIS — E1159 Type 2 diabetes mellitus with other circulatory complications: Secondary | ICD-10-CM | POA: Diagnosis not present

## 2024-02-03 DIAGNOSIS — E782 Mixed hyperlipidemia: Secondary | ICD-10-CM | POA: Diagnosis not present

## 2024-02-03 DIAGNOSIS — E1122 Type 2 diabetes mellitus with diabetic chronic kidney disease: Secondary | ICD-10-CM | POA: Diagnosis not present

## 2024-02-08 ENCOUNTER — Telehealth: Payer: Self-pay

## 2024-02-08 NOTE — Telephone Encounter (Signed)
 Received referral from Dr Darylene Epley for OSA, pt did not tolerate CPAP - ? Inspire therapy. Placed in sleep mailbox

## 2024-02-17 NOTE — Telephone Encounter (Addendum)
 UPDATE - 03/01/24: Dr. Chalice has declined to see pt.   02/17/24 - Referral is requesting Dr. Chalice for inspire - I have left this for Dr. Chalice to review once she is back in the office.

## 2024-02-28 DIAGNOSIS — J449 Chronic obstructive pulmonary disease, unspecified: Secondary | ICD-10-CM | POA: Diagnosis not present

## 2024-02-28 DIAGNOSIS — J45998 Other asthma: Secondary | ICD-10-CM | POA: Diagnosis not present

## 2024-02-28 DIAGNOSIS — J45909 Unspecified asthma, uncomplicated: Secondary | ICD-10-CM | POA: Diagnosis not present

## 2024-02-28 DIAGNOSIS — J441 Chronic obstructive pulmonary disease with (acute) exacerbation: Secondary | ICD-10-CM | POA: Diagnosis not present

## 2024-03-18 DIAGNOSIS — H353221 Exudative age-related macular degeneration, left eye, with active choroidal neovascularization: Secondary | ICD-10-CM | POA: Diagnosis not present

## 2024-03-30 DIAGNOSIS — J441 Chronic obstructive pulmonary disease with (acute) exacerbation: Secondary | ICD-10-CM | POA: Diagnosis not present

## 2024-03-30 DIAGNOSIS — J45998 Other asthma: Secondary | ICD-10-CM | POA: Diagnosis not present

## 2024-03-30 DIAGNOSIS — J45909 Unspecified asthma, uncomplicated: Secondary | ICD-10-CM | POA: Diagnosis not present

## 2024-03-30 DIAGNOSIS — J449 Chronic obstructive pulmonary disease, unspecified: Secondary | ICD-10-CM | POA: Diagnosis not present

## 2024-04-29 DIAGNOSIS — H353112 Nonexudative age-related macular degeneration, right eye, intermediate dry stage: Secondary | ICD-10-CM | POA: Diagnosis not present

## 2024-04-29 DIAGNOSIS — H2513 Age-related nuclear cataract, bilateral: Secondary | ICD-10-CM | POA: Diagnosis not present

## 2024-04-29 DIAGNOSIS — H35033 Hypertensive retinopathy, bilateral: Secondary | ICD-10-CM | POA: Diagnosis not present

## 2024-04-29 DIAGNOSIS — R739 Hyperglycemia, unspecified: Secondary | ICD-10-CM | POA: Diagnosis not present

## 2024-04-29 DIAGNOSIS — E119 Type 2 diabetes mellitus without complications: Secondary | ICD-10-CM | POA: Diagnosis not present

## 2024-04-29 DIAGNOSIS — H353221 Exudative age-related macular degeneration, left eye, with active choroidal neovascularization: Secondary | ICD-10-CM | POA: Diagnosis not present

## 2024-04-29 DIAGNOSIS — E039 Hypothyroidism, unspecified: Secondary | ICD-10-CM | POA: Diagnosis not present

## 2024-04-29 DIAGNOSIS — H43823 Vitreomacular adhesion, bilateral: Secondary | ICD-10-CM | POA: Diagnosis not present

## 2024-04-29 DIAGNOSIS — H35461 Secondary vitreoretinal degeneration, right eye: Secondary | ICD-10-CM | POA: Diagnosis not present

## 2024-04-30 DIAGNOSIS — J45909 Unspecified asthma, uncomplicated: Secondary | ICD-10-CM | POA: Diagnosis not present

## 2024-04-30 DIAGNOSIS — J449 Chronic obstructive pulmonary disease, unspecified: Secondary | ICD-10-CM | POA: Diagnosis not present

## 2024-04-30 DIAGNOSIS — J441 Chronic obstructive pulmonary disease with (acute) exacerbation: Secondary | ICD-10-CM | POA: Diagnosis not present

## 2024-04-30 DIAGNOSIS — J45998 Other asthma: Secondary | ICD-10-CM | POA: Diagnosis not present

## 2024-05-04 DIAGNOSIS — J449 Chronic obstructive pulmonary disease, unspecified: Secondary | ICD-10-CM | POA: Diagnosis not present

## 2024-05-04 DIAGNOSIS — E039 Hypothyroidism, unspecified: Secondary | ICD-10-CM | POA: Diagnosis not present

## 2024-05-04 DIAGNOSIS — Z1339 Encounter for screening examination for other mental health and behavioral disorders: Secondary | ICD-10-CM | POA: Diagnosis not present

## 2024-05-04 DIAGNOSIS — Z23 Encounter for immunization: Secondary | ICD-10-CM | POA: Diagnosis not present

## 2024-05-04 DIAGNOSIS — H353290 Exudative age-related macular degeneration, unspecified eye, stage unspecified: Secondary | ICD-10-CM | POA: Diagnosis not present

## 2024-05-04 DIAGNOSIS — I129 Hypertensive chronic kidney disease with stage 1 through stage 4 chronic kidney disease, or unspecified chronic kidney disease: Secondary | ICD-10-CM | POA: Diagnosis not present

## 2024-05-04 DIAGNOSIS — E785 Hyperlipidemia, unspecified: Secondary | ICD-10-CM | POA: Diagnosis not present

## 2024-05-04 DIAGNOSIS — I10 Essential (primary) hypertension: Secondary | ICD-10-CM | POA: Diagnosis not present

## 2024-05-04 DIAGNOSIS — Z1331 Encounter for screening for depression: Secondary | ICD-10-CM | POA: Diagnosis not present

## 2024-05-04 DIAGNOSIS — E1165 Type 2 diabetes mellitus with hyperglycemia: Secondary | ICD-10-CM | POA: Diagnosis not present

## 2024-05-04 DIAGNOSIS — Z0000 Encounter for general adult medical examination without abnormal findings: Secondary | ICD-10-CM | POA: Diagnosis not present

## 2024-05-04 DIAGNOSIS — E1122 Type 2 diabetes mellitus with diabetic chronic kidney disease: Secondary | ICD-10-CM | POA: Diagnosis not present

## 2024-05-09 ENCOUNTER — Other Ambulatory Visit (HOSPITAL_BASED_OUTPATIENT_CLINIC_OR_DEPARTMENT_OTHER): Payer: Self-pay | Admitting: Family Medicine

## 2024-05-09 DIAGNOSIS — I10 Essential (primary) hypertension: Secondary | ICD-10-CM | POA: Diagnosis not present

## 2024-05-09 DIAGNOSIS — M545 Low back pain, unspecified: Secondary | ICD-10-CM | POA: Diagnosis not present

## 2024-05-09 DIAGNOSIS — M170 Bilateral primary osteoarthritis of knee: Secondary | ICD-10-CM | POA: Diagnosis not present

## 2024-05-11 ENCOUNTER — Ambulatory Visit (HOSPITAL_BASED_OUTPATIENT_CLINIC_OR_DEPARTMENT_OTHER)
Admission: RE | Admit: 2024-05-11 | Discharge: 2024-05-11 | Disposition: A | Discharge status: Home / Self Care | Source: Ambulatory Visit | Attending: Family Medicine | Admitting: Family Medicine

## 2024-05-11 ENCOUNTER — Other Ambulatory Visit (HOSPITAL_BASED_OUTPATIENT_CLINIC_OR_DEPARTMENT_OTHER): Payer: Self-pay | Admitting: Family Medicine

## 2024-05-11 DIAGNOSIS — M438X6 Other specified deforming dorsopathies, lumbar region: Secondary | ICD-10-CM | POA: Diagnosis not present

## 2024-05-11 DIAGNOSIS — M47816 Spondylosis without myelopathy or radiculopathy, lumbar region: Secondary | ICD-10-CM | POA: Diagnosis not present

## 2024-05-11 DIAGNOSIS — M5126 Other intervertebral disc displacement, lumbar region: Secondary | ICD-10-CM | POA: Diagnosis not present

## 2024-05-11 DIAGNOSIS — H2513 Age-related nuclear cataract, bilateral: Secondary | ICD-10-CM | POA: Diagnosis not present

## 2024-05-11 DIAGNOSIS — H524 Presbyopia: Secondary | ICD-10-CM | POA: Diagnosis not present

## 2024-05-11 DIAGNOSIS — M48061 Spinal stenosis, lumbar region without neurogenic claudication: Secondary | ICD-10-CM | POA: Diagnosis not present

## 2024-05-11 DIAGNOSIS — M545 Low back pain, unspecified: Secondary | ICD-10-CM

## 2024-05-19 ENCOUNTER — Ambulatory Visit: Admitting: Pulmonary Disease

## 2024-05-19 DIAGNOSIS — J4489 Other specified chronic obstructive pulmonary disease: Secondary | ICD-10-CM

## 2024-05-19 DIAGNOSIS — G4733 Obstructive sleep apnea (adult) (pediatric): Secondary | ICD-10-CM

## 2024-05-24 ENCOUNTER — Encounter: Payer: Self-pay | Admitting: *Deleted

## 2024-05-24 NOTE — Progress Notes (Signed)
 Gabrielle Randall                                          MRN: [id number]   05/24/2024   The VBCI Quality Team Specialist reviewed this patient medical record for the purposes of chart review for care gap closure. The following were reviewed: chart review for care gap closure-kidney health evaluation for diabetes:eGFR  and uACR.    VBCI Quality Team

## 2024-05-25 DIAGNOSIS — M6281 Muscle weakness (generalized): Secondary | ICD-10-CM | POA: Diagnosis not present

## 2024-05-25 DIAGNOSIS — M62830 Muscle spasm of back: Secondary | ICD-10-CM | POA: Diagnosis not present

## 2024-05-25 DIAGNOSIS — M1712 Unilateral primary osteoarthritis, left knee: Secondary | ICD-10-CM | POA: Diagnosis not present

## 2024-05-25 DIAGNOSIS — M545 Low back pain, unspecified: Secondary | ICD-10-CM | POA: Diagnosis not present

## 2024-05-25 DIAGNOSIS — M1711 Unilateral primary osteoarthritis, right knee: Secondary | ICD-10-CM | POA: Diagnosis not present

## 2024-05-30 DIAGNOSIS — J441 Chronic obstructive pulmonary disease with (acute) exacerbation: Secondary | ICD-10-CM | POA: Diagnosis not present

## 2024-05-30 DIAGNOSIS — J449 Chronic obstructive pulmonary disease, unspecified: Secondary | ICD-10-CM | POA: Diagnosis not present

## 2024-05-30 DIAGNOSIS — J45998 Other asthma: Secondary | ICD-10-CM | POA: Diagnosis not present

## 2024-05-30 DIAGNOSIS — J45909 Unspecified asthma, uncomplicated: Secondary | ICD-10-CM | POA: Diagnosis not present

## 2024-06-01 DIAGNOSIS — H2511 Age-related nuclear cataract, right eye: Secondary | ICD-10-CM | POA: Diagnosis not present

## 2024-06-01 DIAGNOSIS — H25811 Combined forms of age-related cataract, right eye: Secondary | ICD-10-CM | POA: Diagnosis not present

## 2024-06-08 DIAGNOSIS — H353290 Exudative age-related macular degeneration, unspecified eye, stage unspecified: Secondary | ICD-10-CM | POA: Diagnosis not present

## 2024-06-08 DIAGNOSIS — R42 Dizziness and giddiness: Secondary | ICD-10-CM | POA: Diagnosis not present

## 2024-06-08 DIAGNOSIS — Z7985 Long-term (current) use of injectable non-insulin antidiabetic drugs: Secondary | ICD-10-CM | POA: Diagnosis not present

## 2024-06-08 DIAGNOSIS — Z9841 Cataract extraction status, right eye: Secondary | ICD-10-CM | POA: Diagnosis not present

## 2024-06-08 DIAGNOSIS — Z7984 Long term (current) use of oral hypoglycemic drugs: Secondary | ICD-10-CM | POA: Diagnosis not present

## 2024-06-08 DIAGNOSIS — Z79899 Other long term (current) drug therapy: Secondary | ICD-10-CM | POA: Diagnosis not present

## 2024-06-08 DIAGNOSIS — E1165 Type 2 diabetes mellitus with hyperglycemia: Secondary | ICD-10-CM | POA: Diagnosis not present

## 2024-06-21 DIAGNOSIS — M62830 Muscle spasm of back: Secondary | ICD-10-CM | POA: Diagnosis not present

## 2024-06-21 DIAGNOSIS — M6281 Muscle weakness (generalized): Secondary | ICD-10-CM | POA: Diagnosis not present

## 2024-06-21 DIAGNOSIS — M545 Low back pain, unspecified: Secondary | ICD-10-CM | POA: Diagnosis not present

## 2024-06-21 DIAGNOSIS — M1712 Unilateral primary osteoarthritis, left knee: Secondary | ICD-10-CM | POA: Diagnosis not present

## 2024-06-21 DIAGNOSIS — M1711 Unilateral primary osteoarthritis, right knee: Secondary | ICD-10-CM | POA: Diagnosis not present

## 2024-06-22 ENCOUNTER — Encounter (HOSPITAL_BASED_OUTPATIENT_CLINIC_OR_DEPARTMENT_OTHER): Payer: Self-pay | Admitting: Cardiology

## 2024-06-22 ENCOUNTER — Ambulatory Visit (HOSPITAL_BASED_OUTPATIENT_CLINIC_OR_DEPARTMENT_OTHER): Admitting: Cardiology

## 2024-06-22 VITALS — BP 154/76 | HR 100 | Ht 60.0 in | Wt 228.1 lb

## 2024-06-22 DIAGNOSIS — Z6841 Body Mass Index (BMI) 40.0 and over, adult: Secondary | ICD-10-CM

## 2024-06-22 DIAGNOSIS — E66813 Obesity, class 3: Secondary | ICD-10-CM

## 2024-06-22 DIAGNOSIS — E7800 Pure hypercholesterolemia, unspecified: Secondary | ICD-10-CM | POA: Diagnosis not present

## 2024-06-22 DIAGNOSIS — I25118 Atherosclerotic heart disease of native coronary artery with other forms of angina pectoris: Secondary | ICD-10-CM | POA: Diagnosis not present

## 2024-06-22 DIAGNOSIS — R0609 Other forms of dyspnea: Secondary | ICD-10-CM

## 2024-06-22 MED ORDER — ISOSORBIDE MONONITRATE ER 30 MG PO TB24: 30.0000 mg | ORAL_TABLET | Freq: Every day | ORAL | 3 refills | Status: DC

## 2024-06-22 MED ORDER — ROSUVASTATIN CALCIUM 20 MG PO TABS: 20.0000 mg | ORAL_TABLET | Freq: Every day | ORAL | 3 refills | Status: DC

## 2024-06-22 MED ORDER — ISOSORBIDE MONONITRATE ER 30 MG PO TB24: 30.0000 mg | ORAL_TABLET | Freq: Every day | ORAL | 3 refills | Status: AC

## 2024-06-22 NOTE — Progress Notes (Signed)
 Cardiology Office Note:  .   Date:  06/22/2024  ID:  Gabrielle Randall, DOB July 12, 1946, MRN [id number] PCP: Waylan Almarie SAUNDERS, MD  [facility] Providers Cardiologist:  Shelda Bruckner, MD {  History of Present Illness: .   Gabrielle Randall is a 78 y.o. female with a hx of CAD, hypertension, type 2 diabetes, obesity, OSA, COPD, and asthma, who is seen for follow-up. I initially met her 11/05/2021 as a new consult at the request of Waylan Almarie SAUNDERS, MD for the evaluation and management of DOE and orthopnea.   History: cardiac/coronary CT with calcium  score 11/08/2021. This showed a coronary calcium  score of 1078. This was 96th percentile for age and sex matched control. Also noted at least moderate CAD, CADRADS = 3, with main area of concern mid LAD. This lesion was positive for stenosis by FFR.   Today: Has been feeling more short of breath, gradually worsening over the last 5-6 mos. Worse with exertion, especially walking or climbing stairs. No issues when she does physical therapy. Sometimes she is wheezing with the shortness of breath, other times not. Doesn't think using her rescue inhaler prior to walking helped. Once she sits and rests, she feels better. No chest heaviness/pressure with these episodes. Only chest discomfort is a rare, brief catch in her chest that is gone instantly.   Has not tried nitroglycerin  with the shortness of breath. We discussed this as potential anginal equivalent, options for testing and workup, see below.  Blood pressures at home are well controlled. This AM was 124/73. Tends to be elevated in the office.   Reviewed her lipids, not at goal. She thinks she is still taking 10 mg dose daily.   ROS: Denies chest pain. No PND, orthopnea, LE edema or unexpected weight gain. No syncope or palpitations. ROS otherwise negative except as noted.   Studies Reviewed: SABRA    EKG:  EKG Interpretation Date/Time:  Wednesday June 22 2024 14:25:22  EDT Ventricular Rate:  92 PR Interval:  170 QRS Duration:  78 QT Interval:  356 QTC Calculation: 440 R Axis:   -13  Text Interpretation: Sinus rhythm with Premature atrial complexes When compared with ECG of 14-Jul-2023 16:52, Premature atrial complexes are now Present Confirmed by Bruckner Shelda [phone]) on 06/22/2024 3:05:57 PM    Physical Exam:   VS:  BP (!) 154/76   Pulse 100   Ht 5' (1.524 m)   Wt 228 lb 1.6 oz (103.5 kg)   SpO2 97%   BMI 44.55 kg/m    Wt Readings from Last 3 Encounters:  06/22/24 228 lb 1.6 oz (103.5 kg)  07/14/23 218 lb (98.9 kg)  12/26/22 228 lb (103.4 kg)    GEN: Well nourished, well developed in no acute distress HEENT: Normal, moist mucous membranes NECK: No JVD CARDIAC: regular rhythm, normal S1 and S2, no rubs or gallops. 1/6 systolic murmur RUSB. VASCULAR: Radial and DP pulses 2+ bilaterally. No carotid bruits RESPIRATORY:  Clear to auscultation without rales, wheezing or rhonchi  ABDOMEN: Soft, non-tender, non-distended MUSCULOSKELETAL:  Ambulates independently SKIN: Warm and dry, no edema NEUROLOGIC:  Alert and oriented x 3. No focal neuro deficits noted. PSYCHIATRIC:  Normal affect    ASSESSMENT AND PLAN: .    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 Thinks she is taking 10 mg dose daily. Will increase to 20 mg rosuvastatin ,  if she tolerates recheck lipids in 3 mos -recent lipids, per [facility] from 01/27/24 show Tchol 175, HDL 58, LDL 100, TG 84. -notes progressive dyspnea on exertion. We discussed options for further evaluation and medication management as well. After shared decision making, will order cardiac PET to evaluate for ischemia. Will also start low dose imdur and monitor symptoms -she is familiar with SL NG as her husband has it, instructed on use and when to call 911 -reviewed  red flag warning signs that need immediate medical attention -with her respiratory issues, concer would be whether beta blocker might worsen this. Already on amlodipine, imdur will be second anti anginal, will try to titrate this first before trialing beta blocker. -reviewed red flag signs that need immediate medical attention  Informed Consent   Shared Decision Making/Informed Consent{ The risks [chest pain, shortness of breath, cardiac arrhythmias, dizziness, blood pressure fluctuations, myocardial infarction, stroke/transient ischemic attack, nausea, vomiting, allergic reaction, radiation exposure, metallic taste sensation and life-threatening complications (estimated to be 1 in 10,000)], benefits (risk stratification, diagnosing coronary artery disease, treatment guidance) and alternatives of a cardiac PET stress test were discussed in detail with Ms. Trotter and she agrees to proceed.      CV risk counseling and prevention -recommend heart healthy/Mediterranean diet, with whole grains, fruits, vegetable, fish, lean meats, nuts, and olive oil. Limit salt. -recommend moderate walking, 3-5 times/week for 30-50 minutes each session. Aim for at least 150 minutes/week. Goal should be pace of 3 miles/hours, or walking 1.5 miles in 30 minutes -recommend avoidance of tobacco products. Avoid excess alcohol .  Dispo: 6-8 weeks  Signed, Shelda Bruckner, MD   Shelda Bruckner, MD, PhD, [facility] [place]  [facility]  [place]  Heart & Vascular at [facility] [address], [address] [place], [place] [address] [phone]

## 2024-06-22 NOTE — Progress Notes (Incomplete)
  Cardiology Office Note:  .   Date:  06/22/2024  ID:  Gabrielle Randall, DOB [birth year], MRN [id number] PCP: Waylan Almarie SAUNDERS, MD  [facility] Providers Cardiologist:  Shelda Bruckner, MD {  History of Present Illness: .   Gabrielle Randall is a 78 y.o. female with a hx of CAD, hypertension, type 2 diabetes, obesity, OSA, COPD, and asthma, who is seen for follow-up. I initially met her 11/05/2021 as a new consult at the request of Waylan Almarie SAUNDERS, MD for the evaluation and management of DOE and orthopnea.   History: cardiac/coronary CT with calcium  score 11/08/2021. This showed a coronary calcium  score of 1078. This was 96th percentile for age and sex matched control. Also noted at least moderate CAD, CADRADS = 3, with main area of concern mid LAD. This lesion was positive for stenosis by FFR.   Today:  ROS: Denies chest pain, shortness of breath at rest or with normal exertion. No PND, orthopnea, LE edema or unexpected weight gain. No syncope or palpitations. ROS otherwise negative except as noted.   Studies Reviewed: SABRA    EKG:       Physical Exam:   VS:  There were no vitals taken for this visit.   Wt Readings from Last 3 Encounters:  07/14/23 218 lb (98.9 kg)  12/26/22 228 lb (103.4 kg)  03/20/22 213 lb (96.6 kg)    GEN: Well nourished, well developed in no acute distress HEENT: Normal, moist mucous membranes NECK: No JVD CARDIAC: regular rhythm, normal S1 and S2, no rubs or gallops. No murmur. VASCULAR: Radial and DP pulses 2+ bilaterally. No carotid bruits RESPIRATORY:  Clear to auscultation without rales, wheezing or rhonchi  ABDOMEN: Soft, non-tender, non-distended MUSCULOSKELETAL:  Ambulates independently SKIN: Warm and dry, no edema NEUROLOGIC:  Alert and oriented x 3. No focal neuro deficits noted. PSYCHIATRIC:  Normal affect    ASSESSMENT AND PLAN: .    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 Would use nitrate over beta blocker if needed due to chronic dyspnea -discussed PREP program, she will consider and let me know  CV risk counseling and prevention -recommend heart healthy/Mediterranean diet, with whole grains, fruits, vegetable, fish, lean meats, nuts, and olive oil. Limit salt. -recommend moderate walking, 3-5 times/week for 30-50 minutes each session. Aim for at least 150 minutes/week. Goal should be pace of 3 miles/hours, or walking 1.5 miles in 30 minutes -recommend avoidance of tobacco products. Avoid excess alcohol .  Dispo: ***  Signed, Shelda Bruckner, MD   Shelda Bruckner, MD, PhD, [facility]  [facility]  [place]  Heart & Vascular at [facility] [address] [place], [place] [address] [phone]

## 2024-06-22 NOTE — Patient Instructions (Addendum)
 Two medication changes: -increase the rosuvastatin  from 10 mg to 20 mg daily. Goal is for LDL to be less than 70. Your cholesterol test in December (January fine too) will tell us  if this is working. If you don't feel like you tolerate the increased dose, let us  know -starting imdur (isosorbide). We will start with a very low dose. Will start with 30 mg pills. Cut these in half for about 6 days (so 15 mg for 6 days), then if blood pressure isn't dropping below 110/60 consistently increase to 30 mg (one whole pill) daily. Watch for low blood pressure, lightheadedness  Medication Instructions:  As above *If you need a refill on your cardiac medications before your next appointment, please call your pharmacy*  Lab Work: None today  Testing/Procedures: NM Cardiac PET CT Stress test - instructions below  Follow-Up: At [facility], you and your health needs are our priority.  As part of our continuing mission to provide you with exceptional heart care, our providers are all part of one team.  This team includes your primary Cardiologist (physician) and Advanced Practice Providers or APPs (Physician Assistants and Nurse Practitioners) who all work together to provide you with the care you need, when you need it.  Your next appointment:   6-8 week(s)  Provider:   Shelda Bruckner, MD, Rosaline Bane, NP, or Reche Finder, NP       Please report to Radiology at the [facility] Main Entrance 30 minutes early for your test.  [address] [place], [place] [address]                         OR   Please report to Radiology at [facility] Main Entrance, medical mall, 30 mins prior to your test.  [address]  [place], [place]  How to Prepare for Your Cardiac PET/CT Stress Test:  Nothing to eat or drink, except water, 3 hours prior to arrival time.  NO caffeine/decaffeinated products, or chocolate 12 hours prior to arrival. (Please note  decaffeinated beverages (teas/coffees) still contain caffeine).  If you have caffeine within 12 hours prior, the test will need to be rescheduled.  Medication instructions: Do not take nitrates (isosorbide mononitrate, Ranexa) the day before or day of test Do not take tamsulosin the day before or morning of test Hold theophylline containing medications for 12 hours. Hold Dipyridamole 48 hours prior to the test.  Diabetic Preparation: If able to eat breakfast prior to 3 hour fasting, you may take all medications, including your insulin. Do not worry if you miss your breakfast dose of insulin - start at your next meal. If you do not eat prior to 3 hour fast-Hold all diabetes (oral and insulin) medications. Patients who wear a continuous glucose monitor MUST remove the device prior to scanning.  You may take your remaining medications with water.  NO perfume, cologne or lotion on chest or abdomen area. FEMALES - Please avoid wearing dresses to this appointment.  Total time is 1 to 2 hours; you may want to bring reading material for the waiting time.  In preparation for your appointment, medication and supplies will be purchased.  Appointment availability is limited, so if you need to cancel or reschedule, please call the Radiology Department Scheduler at [phone] 24 hours in advance to avoid a cancellation fee of $100.00  What to Expect When you Arrive:  Once you arrive and check in for  your appointment, you will be taken to a preparation room within the Radiology Department.  A technologist or Nurse will obtain your medical history, verify that you are correctly prepped for the exam, and explain the procedure.  Afterwards, an IV will be started in your arm and electrodes will be placed on your skin for EKG monitoring during the stress portion of the exam. Then you will be escorted to the PET/CT scanner.  There, staff will get you positioned on the scanner and obtain a blood pressure and  EKG.  During the exam, you will continue to be connected to the EKG and blood pressure machines.  A small, safe amount of a radioactive tracer will be injected in your IV to obtain a series of pictures of your heart along with an injection of a stress agent.    After your Exam:  It is recommended that you eat a meal and drink a caffeinated beverage to counter act any effects of the stress agent.  Drink plenty of fluids for the remainder of the day and urinate frequently for the first couple of hours after the exam.  Your doctor will inform you of your test results within 7-10 business days.  For more information and frequently asked questions, please visit our website: [email or web address]  For questions about your test or how to prepare for your test, please call: Cardiac Imaging Nurse Navigators Office: [phone]

## 2024-06-23 DIAGNOSIS — M545 Low back pain, unspecified: Secondary | ICD-10-CM | POA: Diagnosis not present

## 2024-06-23 DIAGNOSIS — M62830 Muscle spasm of back: Secondary | ICD-10-CM | POA: Diagnosis not present

## 2024-06-23 DIAGNOSIS — M1712 Unilateral primary osteoarthritis, left knee: Secondary | ICD-10-CM | POA: Diagnosis not present

## 2024-06-23 DIAGNOSIS — M1711 Unilateral primary osteoarthritis, right knee: Secondary | ICD-10-CM | POA: Diagnosis not present

## 2024-06-23 DIAGNOSIS — M6281 Muscle weakness (generalized): Secondary | ICD-10-CM | POA: Diagnosis not present

## 2024-06-28 DIAGNOSIS — M62830 Muscle spasm of back: Secondary | ICD-10-CM | POA: Diagnosis not present

## 2024-06-28 DIAGNOSIS — M545 Low back pain, unspecified: Secondary | ICD-10-CM | POA: Diagnosis not present

## 2024-06-28 DIAGNOSIS — M1712 Unilateral primary osteoarthritis, left knee: Secondary | ICD-10-CM | POA: Diagnosis not present

## 2024-06-28 DIAGNOSIS — M1711 Unilateral primary osteoarthritis, right knee: Secondary | ICD-10-CM | POA: Diagnosis not present

## 2024-06-28 DIAGNOSIS — M6281 Muscle weakness (generalized): Secondary | ICD-10-CM | POA: Diagnosis not present

## 2024-06-30 DIAGNOSIS — J45909 Unspecified asthma, uncomplicated: Secondary | ICD-10-CM | POA: Diagnosis not present

## 2024-06-30 DIAGNOSIS — J449 Chronic obstructive pulmonary disease, unspecified: Secondary | ICD-10-CM | POA: Diagnosis not present

## 2024-06-30 DIAGNOSIS — J441 Chronic obstructive pulmonary disease with (acute) exacerbation: Secondary | ICD-10-CM | POA: Diagnosis not present

## 2024-06-30 DIAGNOSIS — J45998 Other asthma: Secondary | ICD-10-CM | POA: Diagnosis not present

## 2024-07-01 DIAGNOSIS — H353221 Exudative age-related macular degeneration, left eye, with active choroidal neovascularization: Secondary | ICD-10-CM | POA: Diagnosis not present

## 2024-07-05 DIAGNOSIS — R0602 Shortness of breath: Secondary | ICD-10-CM | POA: Diagnosis not present

## 2024-07-05 DIAGNOSIS — M170 Bilateral primary osteoarthritis of knee: Secondary | ICD-10-CM | POA: Diagnosis not present

## 2024-07-05 DIAGNOSIS — J449 Chronic obstructive pulmonary disease, unspecified: Secondary | ICD-10-CM | POA: Diagnosis not present

## 2024-07-05 DIAGNOSIS — M545 Low back pain, unspecified: Secondary | ICD-10-CM | POA: Diagnosis not present

## 2024-07-05 DIAGNOSIS — I10 Essential (primary) hypertension: Secondary | ICD-10-CM | POA: Diagnosis not present

## 2024-07-12 DIAGNOSIS — I10 Essential (primary) hypertension: Secondary | ICD-10-CM | POA: Diagnosis not present

## 2024-07-12 DIAGNOSIS — M170 Bilateral primary osteoarthritis of knee: Secondary | ICD-10-CM | POA: Diagnosis not present

## 2024-07-18 ENCOUNTER — Telehealth (HOSPITAL_COMMUNITY): Payer: Self-pay | Admitting: *Deleted

## 2024-07-18 ENCOUNTER — Encounter: Payer: Self-pay | Admitting: Pulmonary Disease

## 2024-07-18 ENCOUNTER — Ambulatory Visit: Admitting: Pulmonary Disease

## 2024-07-18 VITALS — BP 130/75 | HR 90 | Ht 60.0 in | Wt 226.0 lb

## 2024-07-18 DIAGNOSIS — J4489 Other specified chronic obstructive pulmonary disease: Secondary | ICD-10-CM | POA: Diagnosis not present

## 2024-07-18 DIAGNOSIS — M170 Bilateral primary osteoarthritis of knee: Secondary | ICD-10-CM | POA: Diagnosis not present

## 2024-07-18 MED ORDER — FLUTICASONE-SALMETEROL 250-50 MCG/ACT IN AEPB: 1.0000 | INHALATION_SPRAY | Freq: Two times a day (BID) | RESPIRATORY_TRACT | 11 refills | Status: AC

## 2024-07-18 MED ORDER — SPIRIVA RESPIMAT 2.5 MCG/ACT IN AERS: 2.0000 | INHALATION_SPRAY | Freq: Every day | RESPIRATORY_TRACT | 11 refills | Status: AC

## 2024-07-18 NOTE — Telephone Encounter (Signed)
 Reaching out to patient to offer assistance regarding upcoming cardiac imaging study; pt verbalizes understanding of appt date/time, parking situation and where to check in, pre-test NPO status; name and call back number provided for further questions should they arise  Larey Brick RN Navigator Cardiac Imaging [facility] [phone] office [phone] cell  Patient aware to avoid Imdur and caffeine prior to her cardiac PET study.

## 2024-07-18 NOTE — Progress Notes (Signed)
 Established Patient Pulmonology Office Visit   Subjective:  Patient ID: Gabrielle Randall, female    DOB: 1945-11-05  MRN: [id number]  CC:  Chief Complaint  Patient presents with   Medical Management of Chronic Issues    Pt states SOB x months     Discussed the use of AI scribe software for clinical note transcription with the patient, who gave verbal consent to proceed.  History of Present Illness Gabrielle Randall is a 78 year old female with COPD who presents for follow-up of her condition.  She experiences progressive exertional dyspnea, with shortness of breath during significant movement. Albuterol  provides relief if she rests but not if she continues walking. She uses Advair and Spiriva  inhalers.  She has moderate obstructive sleep apnea with an AHI of 24.3 per hour. She was unable to tolerate CPAP despite trying various masks. Her sleep is erratic with frequent awakenings, and she is a heavy mouth breather. She has not tried an oral appliance due to dentures, which she keeps in at night.  She has a history of asthma and is a former smoker. She also has hypertension and type 2 diabetes. Her obesity and knee pain limit her activity level.        ROS    Current Outpatient Medications:    acetaminophen (TYLENOL) 650 MG CR tablet, Take 650 mg by mouth every 8 (eight) hours as needed for pain., Disp: , Rfl:    albuterol  (VENTOLIN  HFA) 108 (90 Base) MCG/ACT inhaler, Inhale 1-2 puffs into the lungs every 6 (six) hours as needed for wheezing or shortness of breath., Disp: , Rfl:    amLODipine (NORVASC) 5 MG tablet, Take 5 mg by mouth daily., Disp: , Rfl:    aspirin  EC 81 MG tablet, Take 1 tablet (81 mg total) by mouth daily. Swallow whole., Disp: 90 tablet, Rfl: 3   Cholecalciferol (VITAMIN D3) 1.25 MG (50000 UT) CAPS, Take 1 capsule by mouth daily., Disp: , Rfl:    ciprofloxacin  (CILOXAN ) 0.3 % ophthalmic solution, Place 2 drops into the right eye every 2 (two) hours while  awake., Disp: 5 mL, Rfl: 0   isosorbide  mononitrate (IMDUR ) 30 MG 24 hr tablet, Take 1 tablet (30 mg total) by mouth daily., Disp: 90 tablet, Rfl: 3   LANTUS SOLOSTAR 100 UNIT/ML Solostar Pen, SMARTSIG:20 Unit(s) SUB-Q Daily, Disp: , Rfl:    levothyroxine (SYNTHROID) 125 MCG tablet, Take 125 mcg by mouth every morning., Disp: , Rfl:    pioglitazone (ACTOS) 30 MG tablet, Take 30 mg by mouth daily., Disp: , Rfl:    rosuvastatin  (CRESTOR ) 20 MG tablet, Take 1 tablet (20 mg total) by mouth daily., Disp: 90 tablet, Rfl: 3   fluticasone -salmeterol (ADVAIR) 250-50 MCG/ACT AEPB, Inhale 1 puff into the lungs 2 (two) times daily., Disp: 60 each, Rfl: 11   Tiotropium Bromide (SPIRIVA  RESPIMAT) 2.5 MCG/ACT AERS, Inhale 2 puffs into the lungs daily., Disp: 4 g, Rfl: 11      Objective:  BP 130/75   Pulse 90   Ht 5' (1.524 m) Comment: per pt  Wt 226 lb (102.5 kg)   SpO2 97%   BMI 44.14 kg/m   Wt Readings from Last 3 Encounters:  07/18/24 226 lb (102.5 kg)  06/22/24 228 lb 1.6 oz (103.5 kg)  07/14/23 218 lb (98.9 kg)    Physical Exam Constitutional:      General: She is not in acute distress.    Appearance: Normal appearance. She is obese.  Eyes:     General: No scleral icterus.    Conjunctiva/sclera: Conjunctivae normal.  Cardiovascular:     Rate and Rhythm: Normal rate and regular rhythm.  Pulmonary:     Breath sounds: No wheezing, rhonchi or rales.  Musculoskeletal:     Right lower leg: No edema.     Left lower leg: No edema.  Skin:    General: Skin is warm and dry.  Neurological:     General: No focal deficit present.      Diagnostic Review:       Assessment & Plan:   Assessment & Plan Asthma-COPD overlap syndrome (HCC)  Orders:   fluticasone -salmeterol (ADVAIR) 250-50 MCG/ACT AEPB; Inhale 1 puff into the lungs 2 (two) times daily.   Tiotropium Bromide (SPIRIVA  RESPIMAT) 2.5 MCG/ACT AERS; Inhale 2 puffs into the lungs daily.   Assessment and Plan Assessment &  Plan Chronic obstructive pulmonary disease (COPD) COPD with progressive exertional dyspnea. On maximum inhaler therapy. Albuterol  provides temporary relief. Cardiac PET scheduled to rule out cardiac causes. Discussed potential Otuvera nebulized therapy if cardiac evaluation is unremarkable. - Continue Advair and Spiriva  inhalers. - Use albuterol  as needed for dyspnea. - Await results of cardiac PET evaluation. - If cardiac evaluation is unremarkable, initiate Ohtuvayre  nebulized therapy.  Obstructive sleep apnea, moderate Moderate obstructive sleep apnea with AHI of 24.3 per hour. CPAP not tolerated. Discussed oral appliance, complicated by dentures. Weight loss could improve symptoms.  Obesity Contributing to decreased activity and potential exacerbation of COPD and sleep apnea. Previous weight loss attempts unsuccessful. Discussed alternative exercise options and gradual activity increase. - Explore water aerobics and stationary biking for exercise. - Gradually increase physical activity to 20-30 minutes of continuous activity. - Consider referral to healthy weight management and weight loss clinic if needed.      Return in about 6 months (around 01/15/2025) for f/u visit Dr. Kara.   Dorn KATHEE Kara, MD

## 2024-07-18 NOTE — Patient Instructions (Addendum)
 Continue advair 250-50mcg 1 puff twice daily - rinse mouth out after each use  Continue spiriva  2 puffs daily  Use albuterol  inhaler 1-2 puffs every 4-6 hours as needed.  We discussed working on weight loss to help with your shortness of breath - recommend finding a water aerobics course or using a recumbent stationary bike - start with 5-10 minutes of biking and slowly increase your time every 2-3 weeks. Goal is to ride 30 minutes 4-5 days per week.  Follow up in 6 months

## 2024-07-19 ENCOUNTER — Ambulatory Visit (HOSPITAL_COMMUNITY)
Admission: RE | Admit: 2024-07-19 | Discharge: 2024-07-19 | Disposition: A | Discharge status: Home / Self Care | Source: Ambulatory Visit | Attending: Cardiology | Admitting: Cardiology

## 2024-07-19 ENCOUNTER — Other Ambulatory Visit (HOSPITAL_COMMUNITY): Payer: Self-pay | Admitting: Cardiology

## 2024-07-19 DIAGNOSIS — R0609 Other forms of dyspnea: Secondary | ICD-10-CM

## 2024-07-19 LAB — NM PET CT CARDIAC PERFUSION MULTI W/ABSOLUTE BLOODFLOW
LV dias vol: 87 mL (ref 46–106)
LV sys vol: 32 mL (ref 3.8–5.2)
MBFR: 1.45
Nuc Rest EF: 63 %
Nuc Stress EF: 63 %
Rest MBF: 1.64 ml/g/min
Rest Nuclear Isotope Dose: 26.7 mCi
ST Depression (mm): 0 mm
Stress MBF: 2.38 ml/g/min
Stress Nuclear Isotope Dose: 26.8 mCi

## 2024-07-19 MED ORDER — REGADENOSON 0.4 MG/5ML IV SOLN
0.4000 mg | Freq: Once | INTRAVENOUS | Status: AC
  Administered 2024-07-19: 0.4 mg via INTRAVENOUS

## 2024-07-19 MED ORDER — REGADENOSON 0.4 MG/5ML IV SOLN
INTRAVENOUS | Status: AC
  Filled 2024-07-19: qty 5

## 2024-07-19 MED ORDER — RUBIDIUM RB82 GENERATOR (RUBYFILL)
26.7300 | PACK | Freq: Once | INTRAVENOUS | Status: AC
  Administered 2024-07-19: 26.73 via INTRAVENOUS

## 2024-07-19 MED ORDER — RUBIDIUM RB82 GENERATOR (RUBYFILL)
26.8300 | PACK | Freq: Once | INTRAVENOUS | Status: AC
  Administered 2024-07-19: 26.83 via INTRAVENOUS

## 2024-07-19 NOTE — Progress Notes (Signed)
 Pt. Tolerated lexi scan well. She was anxious but did make it through.

## 2024-07-30 DIAGNOSIS — J45909 Unspecified asthma, uncomplicated: Secondary | ICD-10-CM | POA: Diagnosis not present

## 2024-07-30 DIAGNOSIS — J449 Chronic obstructive pulmonary disease, unspecified: Secondary | ICD-10-CM | POA: Diagnosis not present

## 2024-07-30 DIAGNOSIS — J441 Chronic obstructive pulmonary disease with (acute) exacerbation: Secondary | ICD-10-CM | POA: Diagnosis not present

## 2024-07-30 DIAGNOSIS — J45998 Other asthma: Secondary | ICD-10-CM | POA: Diagnosis not present

## 2024-08-01 ENCOUNTER — Ambulatory Visit (HOSPITAL_BASED_OUTPATIENT_CLINIC_OR_DEPARTMENT_OTHER): Admitting: Cardiology

## 2024-08-01 ENCOUNTER — Encounter (HOSPITAL_BASED_OUTPATIENT_CLINIC_OR_DEPARTMENT_OTHER): Payer: Self-pay | Admitting: Cardiology

## 2024-08-01 VITALS — BP 146/78 | HR 101 | Ht 60.0 in | Wt 226.9 lb

## 2024-08-01 DIAGNOSIS — E7800 Pure hypercholesterolemia, unspecified: Secondary | ICD-10-CM

## 2024-08-01 DIAGNOSIS — I10 Essential (primary) hypertension: Secondary | ICD-10-CM | POA: Diagnosis not present

## 2024-08-01 DIAGNOSIS — E039 Hypothyroidism, unspecified: Secondary | ICD-10-CM | POA: Diagnosis not present

## 2024-08-01 DIAGNOSIS — R0609 Other forms of dyspnea: Secondary | ICD-10-CM

## 2024-08-01 DIAGNOSIS — E559 Vitamin D deficiency, unspecified: Secondary | ICD-10-CM | POA: Diagnosis not present

## 2024-08-01 DIAGNOSIS — E1165 Type 2 diabetes mellitus with hyperglycemia: Secondary | ICD-10-CM | POA: Diagnosis not present

## 2024-08-01 DIAGNOSIS — E66813 Obesity, class 3: Secondary | ICD-10-CM

## 2024-08-01 DIAGNOSIS — I25118 Atherosclerotic heart disease of native coronary artery with other forms of angina pectoris: Secondary | ICD-10-CM

## 2024-08-01 DIAGNOSIS — Z6841 Body Mass Index (BMI) 40.0 and over, adult: Secondary | ICD-10-CM | POA: Diagnosis not present

## 2024-08-01 NOTE — Progress Notes (Signed)
 Cardiology Office Note:  .   Date:  08/01/2024  ID:  Gabrielle Randall, DOB Oct 12, 1945, MRN [id number] PCP: Waylan Almarie SAUNDERS, MD  [facility] Providers Cardiologist:  Shelda Bruckner, MD {  History of Present Illness: .   Gabrielle Randall is a 78 y.o. female with a hx of CAD, hypertension, type 2 diabetes, obesity, OSA, COPD, and asthma, who is seen for follow-up. I initially met her 11/05/2021 as a new consult at the request of Waylan Almarie SAUNDERS, MD for the evaluation and management of DOE and orthopnea.   History: cardiac/coronary CT with calcium  score 11/08/2021. This showed a coronary calcium  score of 1078. This was 96th percentile for age and sex matched control. Also noted at least moderate CAD, CADRADS = 3, with main area of concern mid LAD. This lesion was positive for stenosis by FFR.   Today: Reviewed her cardiac PET. This shows no ischemia. Her myocardial blood flow was reported as abnormal, but this may be inaccurate due to high resting flows. She feels that her shortness of breath is slightly improved with imdur  start. No chest pain. Plans to discuss with her pulmonologist whether a change in inhalers might be beneficial.  Trialed rosuvastatin , had severe back pain after taking it for about a week, stopped. Has not tolerated even at low doses. Also did not tolerate atorvastatin in the past. Discussed PCSK9i but her sister had severe reaction to this. Trialed ezetimibe before, unclear if she tolerated this or why it was stopped.   ROS: Denies chest pain. No PND, orthopnea, LE edema or unexpected weight gain. No syncope or palpitations. ROS otherwise negative except as noted.   Studies Reviewed: SABRA    EKG:       Physical Exam:   VS:  BP (!) 146/78   Pulse (!) 101   Ht 5' (1.524 m)   Wt 226 lb 14.4 oz (102.9 kg)   SpO2 97%   BMI 44.31 kg/m    Wt Readings from Last 3 Encounters:  08/01/24 226 lb 14.4 oz (102.9 kg)  07/18/24 226 lb (102.5 kg)  06/22/24 228 lb  1.6 oz (103.5 kg)    GEN: Well nourished, well developed in no acute distress HEENT: Normal, moist mucous membranes NECK: No JVD CARDIAC: regular rhythm, normal S1 and S2, no rubs or gallops. 1/6 systolic murmur RUSB. VASCULAR: Radial and DP pulses 2+ bilaterally. No carotid bruits RESPIRATORY:  Clear to auscultation without rales, wheezing or rhonchi  ABDOMEN: Soft, non-tender, non-distended MUSCULOSKELETAL:  Ambulates independently SKIN: Warm and dry, no edema NEUROLOGIC:  Alert and oriented x 3. No focal neuro deficits noted. PSYCHIATRIC:  Normal affect    ASSESSMENT AND PLAN: .    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 Her sister had severe issues on repatha, she is very concerned about this.  -she was on ezetimibe but isn't sure if she tolerated it or why it was stopped -I cannot see that she has been on nexletol. Will discuss at her follow up -lipids per [facility] from 01/27/24 show Tchol 175, HDL 58, LDL 100, TG 84. -cardiac PET without ischemia -she is familiar with SL NG as her husband has it, instructed on use and when to call 911 -reviewed red flag warning signs that need immediate medical attention -felt slightly better with addition of imdur . Would be cautious with beta blocker given lung  history. Already on amlodipine -reviewed red flag signs that need immediate medical attention  Hypertension -typically elevated in the office/with initial readings -continue amlodipine, isosorbide  -continue to monitor at home to determine if medications require adjustment  CV risk counseling and prevention -recommend heart healthy/Mediterranean diet, with whole grains, fruits, vegetable, fish, lean meats, nuts, and olive oil. Limit salt. -recommend moderate walking, 3-5 times/week for 30-50 minutes each session. Aim for at least 150 minutes/week.  Goal should be pace of 3 miles/hours, or walking 1.5 miles in 30 minutes -recommend avoidance of tobacco products. Avoid excess alcohol .  Dispo: 3 mos  Signed, Shelda Bruckner, MD   Shelda Bruckner, MD, PhD, [facility]  [facility]  [place]  [facility] at [facility] [address], [address] [place], [place] [address] [phone]

## 2024-08-01 NOTE — Patient Instructions (Signed)
 Medication Instructions:  No changes today *If you need a refill on your cardiac medications before your next appointment, please call your pharmacy*  Lab Work:  none If you have labs (blood work) drawn today and your tests are completely normal, you will receive your results only by: MyChart Message (if you have MyChart) OR A paper copy in the mail If you have any lab test that is abnormal or we need to change your treatment, we will call you to review the results.  Testing/Procedures: none  Follow-Up: At [facility], you and your health needs are our priority.  As part of our continuing mission to provide you with exceptional heart care, our providers are all part of one team.  This team includes your primary Cardiologist (physician) and Advanced Practice Providers or APPs (Physician Assistants and Nurse Practitioners) who all work together to provide you with the care you need, when you need it.  Your next appointment:   3 month(s)  Provider:   Shelda Bruckner, MD, Rosaline Bane, NP, or Reche Finder, NP

## 2024-08-29 NOTE — Progress Notes (Signed)
 RONI FRIBERG                                          MRN: [id number]   08/29/2024   The [facility] Quality Team Specialist reviewed this patient medical record for the purposes of chart review for care gap closure. The following were reviewed: chart review for care gap closure-kidney health evaluation for diabetes:eGFR  and uACR.    [facility]BCI Quality Team

## 2024-11-04 ENCOUNTER — Ambulatory Visit (HOSPITAL_BASED_OUTPATIENT_CLINIC_OR_DEPARTMENT_OTHER): Admitting: Cardiology

## 2025-01-23 ENCOUNTER — Ambulatory Visit: Admitting: Pulmonary Disease
